# Patient Record
Sex: Female | Born: 2000 | Race: White | Hispanic: No | Marital: Single | Smoking: Never smoker
Health system: Southern US, Community
[De-identification: ages and names within clinical notes are randomized; demographics above are authoritative.]

## PROBLEM LIST (undated history)

## (undated) DIAGNOSIS — R6250 Unspecified lack of expected normal physiological development in childhood: Secondary | ICD-10-CM

## (undated) DIAGNOSIS — Z8679 Personal history of other diseases of the circulatory system: Secondary | ICD-10-CM

## (undated) DIAGNOSIS — R278 Other lack of coordination: Secondary | ICD-10-CM

## (undated) DIAGNOSIS — R4189 Other symptoms and signs involving cognitive functions and awareness: Secondary | ICD-10-CM

## (undated) DIAGNOSIS — E109 Type 1 diabetes mellitus without complications: Secondary | ICD-10-CM

## (undated) DIAGNOSIS — Z8249 Family history of ischemic heart disease and other diseases of the circulatory system: Secondary | ICD-10-CM

## (undated) DIAGNOSIS — F8189 Other developmental disorders of scholastic skills: Secondary | ICD-10-CM

## (undated) HISTORY — DX: Type 1 diabetes mellitus without complications: E10.9

## (undated) HISTORY — DX: Other symptoms and signs involving cognitive functions and awareness: R41.89

## (undated) HISTORY — PX: DILATION AND CURETTAGE, DIAGNOSTIC / THERAPEUTIC: SUR384

## (undated) HISTORY — DX: Unspecified lack of expected normal physiological development in childhood: R62.50

## (undated) HISTORY — PX: MULTIPLE TOOTH EXTRACTIONS: SHX2053

## (undated) HISTORY — DX: Other developmental disorders of scholastic skills: F81.89

## (undated) HISTORY — PX: INTRAUTERINE DEVICE (IUD) INSERTION: SHX5877

---
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
## 2019-01-08 DIAGNOSIS — E109 Type 1 diabetes mellitus without complications: Secondary | ICD-10-CM | POA: Diagnosis not present

## 2019-01-15 ENCOUNTER — Encounter (INDEPENDENT_AMBULATORY_CARE_PROVIDER_SITE_OTHER): Payer: Self-pay | Admitting: Family

## 2019-01-15 ENCOUNTER — Other Ambulatory Visit: Payer: Self-pay

## 2019-01-15 ENCOUNTER — Ambulatory Visit (INDEPENDENT_AMBULATORY_CARE_PROVIDER_SITE_OTHER): Payer: BC Managed Care – PPO | Admitting: Family

## 2019-01-15 VITALS — BP 110/72 | HR 80 | Ht 60.63 in | Wt 133.2 lb

## 2019-01-15 DIAGNOSIS — R6250 Unspecified lack of expected normal physiological development in childhood: Secondary | ICD-10-CM

## 2019-01-15 DIAGNOSIS — R748 Abnormal levels of other serum enzymes: Secondary | ICD-10-CM | POA: Diagnosis not present

## 2019-01-15 DIAGNOSIS — E10649 Type 1 diabetes mellitus with hypoglycemia without coma: Secondary | ICD-10-CM

## 2019-01-15 DIAGNOSIS — E109 Type 1 diabetes mellitus without complications: Secondary | ICD-10-CM | POA: Diagnosis not present

## 2019-01-15 DIAGNOSIS — Z794 Long term (current) use of insulin: Secondary | ICD-10-CM

## 2019-01-15 LAB — POCT GLYCOSYLATED HEMOGLOBIN (HGB A1C): Hemoglobin A1C: 6.8 % — AB (ref 4.0–5.6)

## 2019-01-15 LAB — POCT GLUCOSE (DEVICE FOR HOME USE): POC Glucose: 195 mg/dl — AB (ref 70–99)

## 2019-01-15 NOTE — Patient Instructions (Signed)
-  Always have fast sugar with you in case of low blood sugar (glucose tabs, regular juice or soda, candy) -Always wear your ID that states you have diabetes -Always bring your meter to your visit -Call/Email if you want to review blood sugars  Increase to 8 units of Lantus  Novolog per plan   Continue vitamin D supplement.

## 2019-01-15 NOTE — Progress Notes (Signed)
Pediatric Endocrinology Diabetes Consultation Follow-up Visit  Melanie ReiningKathryn Morrow 12/18/2000 [id number]  Chief Complaint: Follow-up type 1 diabetes   Ermalinda BarriosBrassfield, Mark, MD (Inactive)   HPI: Melanie Morrow  is a 18 y.o. female presenting for follow-up of type 1 diabetes. she is accompanied to this visit by her mother.  1. Melanie Morrow is a 18 y.o. with history of pervasive developmental disorder (also non-verbal) who was diagnosed with T1DM on 08/22/16 after presenting to her PCP with polyuria, polydipsia, weight loss and hyperglycemia.  At PCP's office she had BG of 238 with UA showing 2+ glucose and 2+ ketones.  She was admitted to [facility] pediatric floor where she was not in DKA.  She was started on an MDI regimen.  TFTs on admission were normal with negative ICA Ab and negative insulin Ab; GAD Ab were positive.  C-peptide was low at 1.9.  A1c was 10%.  2. Since last visit on 09/2018, she has been well.  No ED visits or hospitalizations.   They have not been doing a lot, mainly just virtual school and hanging out at home. They did get to spend time at the beach this summer which Melanie Morrow enjoyed. Mom reports that things are going well overall with diabetes. They have noticed blood sugars running a little bit higher in the mornings. She is not having very many lows.   Mom is concerned about changing her insulin plan because she spends a lot of time with her Grandparents and they may struggle with change. A few weeks ago, Grandmother was giving Melanie Morrow an injection and the needle broke. They were unsure if it broke off in [place] or not so they got xray done and saw PCP. No needle remnants were found.   Melanie Morrow is now taking a liquid Vitamin D supplement that has 2000 units per day. She takes 1 drop per day    Insulin regimen: Lantus 7 units  . Novolog 150/50/15 half unit plan  Hypoglycemia: Few lows since last visit, no glucagon needed. CGM Download: Using dexcom G6  - Avg Bg 179  - target range; in  target 45%, above target 54% and below target 1%   - Pattern of hyperglycemia overnight.   Med-alert ID: Wearing a new bracelet today Injection sites: arms, legs and abdomen. Rotating.  Uses arms for CGM  Annual labs due: 08/2019 Ophthalmology due: Not yet   ROS:  All systems reviewed with pertinent positives listed below; otherwise negative. Constitutional:Sleeping well. Weight stable.  Eyes; no vision changes. No blurry vision.  HENT: No difficulty swallowing. No neck pain  Respiratory: No increased work of breathing currently GI: No constipation or diarrhea GU: Occasional spotting. Has IUD.  Musculoskeletal: No joint deformity Neuro: Normal affect. No tremors or headaches.  Endocrine: As above    Past Medical History:   Past Medical History:  Diagnosis Date  . Dyspraxia   . Type 1 diabetes mellitus (HCC) 08/22/2016   +GAD Ab, neg insulin Ab and ICA Ab    Medications:  Outpatient Encounter Medications as of 01/15/2019  Medication Sig  . diazePAM 5 MG/5ML SOLN TAKE 10ML BY MOUTH 1 HOUR PRIOR TO APPOINTMENT  . glucagon 1 MG injection Follow package directions for low blood sugar.  . Insulin Glargine (LANTUS SOLOSTAR) 100 UNIT/ML Solostar Pen Use up to 50 units daily  . NOVOLOG FLEXPEN 100 UNIT/ML FlexPen INJECT UP TO 50 UNITS OF INSULIN PER DAY.   No facility-administered encounter medications on file as of 01/15/2019.     Allergies: Allergies  Allergen Reactions  . Penicillins Hives    amox.    Surgical History: Past Surgical History:  Procedure Laterality Date  . DILATION AND CURETTAGE, DIAGNOSTIC / THERAPEUTIC    . INTRAUTERINE DEVICE (IUD) INSERTION      Family History:  Family History  Problem Relation Age of Onset  . Diabetes Mother   . Diabetes Maternal Uncle   . Diabetes Maternal Grandfather    No family history of T1DM or hypothyroidism   Social History: Lives with: parents, grandparents also involved in her care Going into 11th grade, mother  happy with care provided by school. She will have the same teacher this year as last.  Physical Exam:  Vitals:   01/15/19 0838  BP: 110/72  Pulse: 80  Weight: 133 lb 3.2 oz (60.4 kg)  Height: 5' 0.63" (1.54 m)   BP 110/72   Pulse 80   Ht 5' 0.63" (1.54 m)   Wt 133 lb 3.2 oz (60.4 kg)   BMI 25.48 kg/m  Body mass index: body mass index is 25.48 kg/m. Blood pressure percentiles are not available for patients who are 18 years or older.  Ht Readings from Last 3 Encounters:  01/15/19 5' 0.63" (1.54 m) (8 %, Z= -1.43)*  10/15/18 5' 0.63" (1.54 m) (8 %, Z= -1.42)*  05/05/18 5' (1.524 m) (5 %, Z= -1.66)*   * Growth percentiles are based on CDC (Girls, 2-20 Years) data.   Wt Readings from Last 3 Encounters:  01/15/19 133 lb 3.2 oz (60.4 kg) (62 %, Z= 0.31)*  10/15/18 133 lb 10.1 oz (60.6 kg) (64 %, Z= 0.36)*  05/05/18 130 lb 12.8 oz (59.3 kg) (61 %, Z= 0.29)*   * Growth percentiles are based on CDC (Girls, 2-20 Years) data.    General: Well developed, well nourished female in no acute distress.  Developmentally delayed. Alert and oriented.  Head: Normocephalic, atraumatic.   Eyes:  Pupils equal and round. EOMI.   Sclera white.  No eye drainage.   Ears/Nose/Mouth/Throat: Nares patent, no nasal drainage.  Normal dentition, mucous membranes moist.   Neck: supple, no cervical lymphadenopathy, no thyromegaly Cardiovascular: regular rate, normal S1/S2, no murmurs Respiratory: No increased work of breathing.  Lungs clear to auscultation bilaterally.  No wheezes. Abdomen: soft, nontender, nondistended. Normal bowel sounds.  No appreciable masses  Extremities: warm, well perfused, cap refill < 2 sec.   Musculoskeletal: Normal muscle mass.  Normal strength Skin: warm, dry.  No rash or lesions. Neurologic: alert and oriented, Uses sign language, no tremor   Labs:   Results for orders placed or performed in visit on 01/15/19  POCT Glucose (Device for Home Use)  Result Value Ref Range    Glucose Fasting, POC     POC Glucose 195 (A) 70 - 99 mg/dl  POCT glycosylated hemoglobin (Hb A1C)  Result Value Ref Range   Hemoglobin A1C 6.8 (A) 4.0 - 5.6 %   HbA1c POC (<> result, manual entry)     HbA1c, POC (prediabetic range)     HbA1c, POC (controlled diabetic range)       Assessment/Plan: Knox is a 18 y.o. female with controlled type 1 diabetes on MDI and Dexcom CGM. Armonie and her family are doing an excellent job with her diabetes control. Her blood sugars are running slightly higher overnight. Hemoglobin A1c is 6.8% which meets the ADA goal of <7.5%.   1-4. Controlled diabetes mellitus type 1 without complications (HCC)/Adjustment reaction/Developmental delay/hypoglycemia unawareness - Increase her Lantus 8  units  - novolog 150/50/15 plan  - Reviewed CGM download. Discussed trends and patterns.  - Rotate injection sites to prevent scar tissue.  - bolus 15 minutes prior to eating to limit blood sugar spikes.  - Reviewed carb counting and importance of accurate carb counting.  - Discussed signs and symptoms of hypoglycemia. Always have glucose available.  - POCT glucose and hemoglobin A1c  - Reviewed growth chart.   5. Elevated alkaline phosphatase  - Continue vitmain D supplement (Super daily Vitamin D)    Follow-up:  3 months.   I have spent >25 minutes with >50% of time in counseling, education and instruction. When a patient is on insulin, intensive monitoring of blood glucose levels is necessary to avoid hyperglycemia and hypoglycemia. Severe hyperglycemia/hypoglycemia can lead to hospital admissions and be life threatening.      Hermenia Bers,  FNP-C  Pediatric Specialist  [address] [place]  [place], [address]  Tele: [phone]

## 2019-01-25 ENCOUNTER — Other Ambulatory Visit (INDEPENDENT_AMBULATORY_CARE_PROVIDER_SITE_OTHER): Payer: Self-pay | Admitting: Family

## 2019-03-04 DIAGNOSIS — Z3040 Encounter for surveillance of contraceptives, unspecified: Secondary | ICD-10-CM | POA: Diagnosis not present

## 2019-04-06 ENCOUNTER — Encounter (INDEPENDENT_AMBULATORY_CARE_PROVIDER_SITE_OTHER): Payer: Self-pay

## 2019-04-09 MED ORDER — DEXCOM G6 SENSOR MISC: 1.0000 [IU] | 5 refills | Status: DC

## 2019-04-09 MED ORDER — BD PEN NEEDLE MINI U/F 31G X 5 MM MISC: 5 refills | Status: DC

## 2019-04-09 MED ORDER — DEXCOM G6 TRANSMITTER MISC: 1.0000 [IU] | 1 refills | Status: DC

## 2019-04-11 ENCOUNTER — Encounter (INDEPENDENT_AMBULATORY_CARE_PROVIDER_SITE_OTHER): Payer: Self-pay

## 2019-04-12 MED ORDER — INSULIN PEN NEEDLE 32G X 4 MM MISC: 5 refills | Status: DC

## 2019-04-19 ENCOUNTER — Other Ambulatory Visit: Payer: Self-pay

## 2019-04-19 ENCOUNTER — Encounter (INDEPENDENT_AMBULATORY_CARE_PROVIDER_SITE_OTHER): Payer: Self-pay | Admitting: Family

## 2019-04-19 ENCOUNTER — Ambulatory Visit (INDEPENDENT_AMBULATORY_CARE_PROVIDER_SITE_OTHER): Payer: Medicaid Other | Admitting: Family

## 2019-04-19 VITALS — BP 112/68 | Ht 60.75 in | Wt 133.0 lb

## 2019-04-19 DIAGNOSIS — R748 Abnormal levels of other serum enzymes: Secondary | ICD-10-CM

## 2019-04-19 DIAGNOSIS — R6250 Unspecified lack of expected normal physiological development in childhood: Secondary | ICD-10-CM

## 2019-04-19 DIAGNOSIS — E109 Type 1 diabetes mellitus without complications: Secondary | ICD-10-CM

## 2019-04-19 DIAGNOSIS — R739 Hyperglycemia, unspecified: Secondary | ICD-10-CM

## 2019-04-19 DIAGNOSIS — E10649 Type 1 diabetes mellitus with hypoglycemia without coma: Secondary | ICD-10-CM | POA: Diagnosis not present

## 2019-04-19 LAB — POCT GLYCOSYLATED HEMOGLOBIN (HGB A1C): Hemoglobin A1C: 6.5 % — AB (ref 4.0–5.6)

## 2019-04-19 LAB — POCT GLUCOSE (DEVICE FOR HOME USE): Glucose Fasting, POC: 192 mg/dL — AB (ref 70–99)

## 2019-04-19 NOTE — Progress Notes (Signed)
Pediatric Endocrinology Diabetes Consultation Follow-up Visit  Melanie Morrow 2000-10-06 [id number]  Chief Complaint: Follow-up type 1 diabetes   Melanie Sears, MD (Inactive)   HPI: Melanie Morrow  is a 19 y.o. female presenting for follow-up of type 1 diabetes. she is accompanied to this visit by her mother.  39. Melanie Morrow is a 20 y.o. with history of pervasive developmental disorder (also non-verbal) who was diagnosed with T1DM on 08/22/16 after presenting to her PCP with polyuria, polydipsia, weight loss and hyperglycemia.  At PCP's office she had BG of 238 with UA showing 2+ glucose and 2+ ketones.  She was admitted to [facility] pediatric floor where she was not in DKA.  She was started on an MDI regimen.  TFTs on admission were normal with negative ICA Ab and negative insulin Ab; GAD Ab were positive.  C-peptide was low at 1.9.  A1c was 10%.  2. Since last visit on 12/2018, she has been well.  No ED visits or hospitalizations.   She has mainly been doing online school which she is doing well at and watching movies in her free time. She is wearing Dexcom CGM which is working well for them. Mom reporst things are going great. She has very few blood sugars spikes and only occasional low blood sugars. Mom feels like Melanie Morrow having a fairly predictable diet is very helpful. Mom has a rotation for Melanie Morrow shots so that they only use each area once per day to prevent scar tissue.   Melanie Morrow is now taking a liquid Vitamin D supplement that has 2000 units per day. She takes 1 drop per day   Insulin regimen: Lantus 8 units  . Novolog 150/50/15 half unit plan  Hypoglycemia: Few lows since last visit, no glucagon needed. CGM Download: Using dexcom G6  - Avg Bg 176   - In target 50%, above target 50%   - Overall blood sguars are very well controlled with minimal variability.  Med-alert ID: Wearing a new bracelet today Injection sites: arms, legs and abdomen. Rotating.  Uses arms for CGM  Annual labs  due: 08/2019 Ophthalmology due: Not yet   ROS:  All systems reviewed with pertinent positives listed below; otherwise negative. Constitutional:Sleeping well. Weight stable.   Eyes; no vision changes. No blurry vision.  HENT: No difficulty swallowing. No neck pain  Respiratory: No increased work of breathing currently GI: No constipation or diarrhea GU: Occasional spotting. Has IUD.  Musculoskeletal: No joint deformity Neuro: Normal affect. No tremors or headaches.  Endocrine: As above    Past Medical History:   Past Medical History:  Diagnosis Date  . Dyspraxia   . Type 1 diabetes mellitus ([place]) 08/22/2016   +GAD Ab, neg insulin Ab and ICA Ab    Medications:  Outpatient Encounter Medications as of 04/19/2019  Medication Sig Note  . Continuous Blood Gluc Sensor (DEXCOM G6 SENSOR) MISC 1 Units by Does not apply route as directed. Change sensor every 10 days   . Continuous Blood Gluc Transmit (DEXCOM G6 TRANSMITTER) MISC 1 Units by Does not apply route every 3 (three) months.   . diazePAM 5 MG/5ML SOLN TAKE 10ML BY MOUTH 1 HOUR PRIOR TO APPOINTMENT 04/19/2019: Only before the dentist   . glucagon 1 MG injection Follow package directions for low blood sugar.   . Insulin Glargine (LANTUS SOLOSTAR) 100 UNIT/ML Solostar Pen USE UP TO 50 UNITS DAILY   . Insulin Pen Needle 32G X 4 MM MISC Use to inject insulin 6x daily   .  NOVOLOG FLEXPEN 100 UNIT/ML FlexPen INJECT UP TO 50 UNITS OF INSULIN PER DAY.    No facility-administered encounter medications on file as of 04/19/2019.    Allergies: Allergies  Allergen Reactions  . Penicillins Hives    amox.    Surgical History: Past Surgical History:  Procedure Laterality Date  . DILATION AND CURETTAGE, DIAGNOSTIC / THERAPEUTIC    . INTRAUTERINE DEVICE (IUD) INSERTION      Family History:  Family History  Problem Relation Age of Onset  . Diabetes Mother   . Diabetes Maternal Uncle   . Diabetes Maternal Grandfather    No family  history of T1DM or hypothyroidism   Social History: Lives with: parents, grandparents also involved in her care Going into 11th grade, mother happy with care provided by school. She will have the same teacher this year as last.  Physical Exam:  Vitals:   04/19/19 0833  BP: 112/68  Weight: 133 lb (60.3 kg)  Height: 5' 0.75" (1.543 m)   BP 112/68   Ht 5' 0.75" (1.543 m)   Wt 133 lb (60.3 kg)   BMI 25.34 kg/m  Body mass index: body mass index is 25.34 kg/m. Blood pressure percentiles are not available for patients who are 18 years or older.  Ht Readings from Last 3 Encounters:  04/19/19 5' 0.75" (1.543 m) (8 %, Z= -1.38)*  01/15/19 5' 0.63" (1.54 m) (8 %, Z= -1.43)*  10/15/18 5' 0.63" (1.54 m) (8 %, Z= -1.42)*   * Growth percentiles are based on CDC (Girls, 2-20 Years) data.   Wt Readings from Last 3 Encounters:  04/19/19 133 lb (60.3 kg) (61 %, Z= 0.27)*  01/15/19 133 lb 3.2 oz (60.4 kg) (62 %, Z= 0.31)*  10/15/18 133 lb 10.1 oz (60.6 kg) (64 %, Z= 0.36)*   * Growth percentiles are based on CDC (Girls, 2-20 Years) data.    General: Well developed, well nourished female in no acute distress.  Developmentally delayed. Alert and oriented.  Head: Normocephalic, atraumatic.   Eyes:  Pupils equal and round. EOMI.   Sclera white.  No eye drainage.   Ears/Nose/Mouth/Throat: Nares patent, no nasal drainage.  Normal dentition, mucous membranes moist.   Neck: supple, no cervical lymphadenopathy, no thyromegaly Cardiovascular: regular rate, normal S1/S2, no murmurs Respiratory: No increased work of breathing.  Lungs clear to auscultation bilaterally.  No wheezes. Abdomen: soft, nontender, nondistended. Normal bowel sounds.  No appreciable masses  Extremities: warm, well perfused, cap refill < 2 sec.   Musculoskeletal: Normal muscle mass.  Normal strength Skin: warm, dry.  No rash or lesions. Neurologic: alert and oriented, Uses sign language, no tremor   Labs:   Results for  orders placed or performed in visit on 04/19/19  POCT Glucose (Device for Home Use)  Result Value Ref Range   Glucose Fasting, POC 192 (A) 70 - 99 mg/dL   POC Glucose    POCT glycosylated hemoglobin (Hb A1C)  Result Value Ref Range   Hemoglobin A1C 6.5 (A) 4.0 - 5.6 %   HbA1c POC (<> result, manual entry)     HbA1c, POC (prediabetic range)     HbA1c, POC (controlled diabetic range)       Assessment/Plan: Melanie Morrow is a 19 y.o. female with controlled type 1 diabetes on MDI and Dexcom CGM. Diabetes is well managed by her family. Hemoglobin A1c is 6.5% which meets ADA goal of <7.5%. Will repeat alkaline phosphatase and vitamin D today.   1-4. 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 Discussed trends and patterns.  - Rotate injection sites to prevent scar tissue.   - Reviewed carb counting and importance of accurate carb counting.  - Discussed signs and symptoms of hypoglycemia. Always have glucose available.  - POCT glucose and hemoglobin A1c  - Reviewed growth chart.    5. Elevated alkaline phosphatase  - Continue vitmain D supplement (Super daily Vitamin D)  - Repeat Alk phos and 25 hydroxy vitamin D    Follow-up:  3 months.   >46 spent today reviewing the medical chart, counseling the patient/family, and documenting today's visit.   When a patient is on insulin, intensive monitoring of blood glucose levels is necessary to avoid hyperglycemia and hypoglycemia. Severe hyperglycemia/hypoglycemia can lead to hospital admissions and be life threatening.      Hermenia Bers,  FNP-C  Pediatric Specialist  [address] [place]  [place], [address]  Tele: [phone]

## 2019-04-19 NOTE — Patient Instructions (Signed)
-  Always have fast sugar with you in case of low blood sugar (glucose tabs, regular juice or soda, candy) -Always wear your ID that states you have diabetes -Always bring your meter to your visit -Call/Email if you want to review blood sugars   

## 2019-04-20 LAB — ALKALINE PHOSPHATASE: Alkaline phosphatase (APISO): 768 U/L — ABNORMAL HIGH (ref 36–128)

## 2019-04-20 LAB — VITAMIN D 25 HYDROXY (VIT D DEFICIENCY, FRACTURES): Vit D, 25-Hydroxy: 20 ng/mL — ABNORMAL LOW (ref 30–100)

## 2019-06-13 ENCOUNTER — Other Ambulatory Visit (INDEPENDENT_AMBULATORY_CARE_PROVIDER_SITE_OTHER): Payer: Self-pay | Admitting: Family

## 2019-06-16 ENCOUNTER — Other Ambulatory Visit (INDEPENDENT_AMBULATORY_CARE_PROVIDER_SITE_OTHER): Payer: Self-pay | Admitting: Family

## 2019-06-16 ENCOUNTER — Encounter (INDEPENDENT_AMBULATORY_CARE_PROVIDER_SITE_OTHER): Payer: Self-pay

## 2019-07-22 ENCOUNTER — Encounter (INDEPENDENT_AMBULATORY_CARE_PROVIDER_SITE_OTHER): Payer: Self-pay | Admitting: Family

## 2019-07-22 ENCOUNTER — Ambulatory Visit (INDEPENDENT_AMBULATORY_CARE_PROVIDER_SITE_OTHER): Payer: Medicaid Other | Admitting: Family

## 2019-07-22 ENCOUNTER — Other Ambulatory Visit: Payer: Self-pay

## 2019-07-22 VITALS — BP 118/80 | HR 102 | Ht 60.63 in | Wt 134.8 lb

## 2019-07-22 DIAGNOSIS — E109 Type 1 diabetes mellitus without complications: Secondary | ICD-10-CM

## 2019-07-22 DIAGNOSIS — Z794 Long term (current) use of insulin: Secondary | ICD-10-CM

## 2019-07-22 DIAGNOSIS — R6250 Unspecified lack of expected normal physiological development in childhood: Secondary | ICD-10-CM | POA: Diagnosis not present

## 2019-07-22 DIAGNOSIS — E10649 Type 1 diabetes mellitus with hypoglycemia without coma: Secondary | ICD-10-CM

## 2019-07-22 DIAGNOSIS — R748 Abnormal levels of other serum enzymes: Secondary | ICD-10-CM

## 2019-07-22 DIAGNOSIS — R739 Hyperglycemia, unspecified: Secondary | ICD-10-CM

## 2019-07-22 LAB — POCT GLYCOSYLATED HEMOGLOBIN (HGB A1C): Hemoglobin A1C: 6.3 % — AB (ref 4.0–5.6)

## 2019-07-22 LAB — POCT GLUCOSE (DEVICE FOR HOME USE): Glucose Fasting, POC: 170 mg/dL — AB (ref 70–99)

## 2019-07-22 MED ORDER — ALCOHOL PADS 70 % PADS: MEDICATED_PAD | 6 refills | Status: AC

## 2019-07-22 NOTE — Progress Notes (Signed)
Pediatric Endocrinology Diabetes Consultation Follow-up Visit  [name] 2000/05/31 [id number]  Chief Complaint: Follow-up type 1 diabetes   Melanie Sears, MD   HPI: Melanie Morrow  is a 19 y.o. female presenting for follow-up of type 1 diabetes. she is accompanied to this visit by her mother.  74. Melanie Morrow is a 67 y.o. with history of pervasive developmental disorder (also non-verbal) who was diagnosed with T1DM on 08/22/16 after presenting to her PCP with polyuria, polydipsia, weight loss and hyperglycemia.  At PCP's office she had BG of 238 with UA showing 2+ glucose and 2+ ketones.  She was admitted to [facility] pediatric floor where she was not in DKA.  She was started on an MDI regimen.  TFTs on admission were normal with negative ICA Ab and negative insulin Ab; GAD Ab were positive.  C-peptide was low at 1.9.  A1c was 10%.  2. Since last visit on 04/2018, she has been well.  No ED visits or hospitalizations.   She remains doing virtual school, she also recently got her COVID vaccine. She is wearing Dexcom CGM. Mom feels like her blood sugars have been "running like normal". She will occasionally have a high spike after breakfast if she eats high carb meal. They usually give her shots after she eats to make sure she finishes the meal. Mom feels like over the past month her overnight blood sugars have been higher.   Melanie Morrow is now taking a liquid Vitamin D supplement that has 2000 units per day. She takes 2 drop per day for a total of 4000 units per day   Insulin regimen: Lantus 8 units  . Novolog 150/50/15 half unit plan  Hypoglycemia: Few lows since last visit, no glucagon needed. CGM Download: Using dexcom G6  - Avg Bg 177  - Target range; in target 47%, above target 52% and below target 1%.  Med-alert ID: Wearing a new bracelet today Injection sites: arms, legs and abdomen. Rotating.  Uses arms for CGM  Annual labs due: 08/2019 Ophthalmology due: Not yet   ROS:  All systems  reviewed with pertinent positives listed below; otherwise negative. Constitutional:Sleeping well. Weight stable.   Eyes; no vision changes. No blurry vision.  HENT: No difficulty swallowing. No neck pain  Respiratory: No increased work of breathing currently GI: No constipation or diarrhea GU: Occasional spotting. Has IUD.  Musculoskeletal: No joint deformity Neuro: Normal affect. No tremors or headaches.  Endocrine: As above    Past Medical History:   Past Medical History:  Diagnosis Date  . Dyspraxia   . Type 1 diabetes mellitus ([place]) 08/22/2016   +GAD Ab, neg insulin Ab and ICA Ab    Medications:  Outpatient Encounter Medications as of 07/22/2019  Medication Sig Note  . Continuous Blood Gluc Sensor (DEXCOM G6 SENSOR) MISC 1 Units by Does not apply route as directed. Change sensor every 10 days   . Continuous Blood Gluc Transmit (DEXCOM G6 TRANSMITTER) MISC 1 Units by Does not apply route every 3 (three) months.   . diazePAM 5 MG/5ML SOLN TAKE 10ML BY MOUTH 1 HOUR PRIOR TO APPOINTMENT 04/19/2019: Only before the dentist   . glucagon 1 MG injection Follow package directions for low blood sugar.   . Insulin Pen Needle 32G X 4 MM MISC Use to inject insulin 6x daily   . LANTUS SOLOSTAR 100 UNIT/ML Solostar Pen USE UP TO 50 UNITS DAILY   . NOVOLOG FLEXPEN 100 UNIT/ML FlexPen INJECT UP TO 50 UNITS OF INSULIN PER  DAY.    No facility-administered encounter medications on file as of 07/22/2019.    Allergies: Allergies  Allergen Reactions  . Penicillins Hives    amox.    Surgical History: Past Surgical History:  Procedure Laterality Date  . DILATION AND CURETTAGE, DIAGNOSTIC / THERAPEUTIC    . INTRAUTERINE DEVICE (IUD) INSERTION      Family History:  Family History  Problem Relation Age of Onset  . Diabetes Mother   . Diabetes Maternal Uncle   . Diabetes Maternal Grandfather    No family history of T1DM or hypothyroidism   Social History: Lives with: parents, grandparents  also involved in her care Going into 12th grade, mother happy with care provided by school. She will have the same teacher this year as last.  Physical Exam:  Vitals:   07/22/19 0841  BP: 118/80  Pulse: (!) 102  Weight: 134 lb 12.8 oz (61.1 kg)  Height: 5' 0.63" (1.54 m)   BP 118/80   Pulse (!) 102   Ht 5' 0.63" (1.54 m)   Wt 134 lb 12.8 oz (61.1 kg)   BMI 25.78 kg/m  Body mass index: body mass index is 25.78 kg/m. Blood pressure percentiles are not available for patients who are 18 years or older.  Ht Readings from Last 3 Encounters:  07/22/19 5' 0.63" (1.54 m) (8 %, Z= -1.43)*  04/19/19 5' 0.75" (1.543 m) (8 %, Z= -1.38)*  01/15/19 5' 0.63" (1.54 m) (8 %, Z= -1.43)*   * Growth percentiles are based on CDC (Girls, 2-20 Years) data.   Wt Readings from Last 3 Encounters:  07/22/19 134 lb 12.8 oz (61.1 kg) (63 %, Z= 0.32)*  04/19/19 133 lb (60.3 kg) (61 %, Z= 0.27)*  01/15/19 133 lb 3.2 oz (60.4 kg) (62 %, Z= 0.31)*   * Growth percentiles are based on CDC (Girls, 2-20 Years) data.    General: Well developed, well nourished female in no acute distress.   Head: Normocephalic, atraumatic.   Eyes:  Pupils equal and round. EOMI.   Sclera white.  No eye drainage.   Ears/Nose/Mouth/Throat: Nares patent, no nasal drainage.  Normal dentition, mucous membranes moist.   Neck: supple, no cervical lymphadenopathy, no thyromegaly Cardiovascular: regular rate, normal S1/S2, no murmurs Respiratory: No increased work of breathing.  Lungs clear to auscultation bilaterally.  No wheezes. Abdomen: soft, nontender, nondistended. Normal bowel sounds.  No appreciable masses  Extremities: warm, well perfused, cap refill < 2 sec.   Musculoskeletal: Normal muscle mass.  Normal strength Skin: warm, dry.  No rash or lesions. Neurologic: alert, + developmental delay, no tremor   Labs:   Results for orders placed or performed in visit on 07/22/19  POCT glycosylated hemoglobin (Hb A1C)  Result  Value Ref Range   Hemoglobin A1C 6.3 (A) 4.0 - 5.6 %   HbA1c POC (<> result, manual entry)     HbA1c, POC (prediabetic range)     HbA1c, POC (controlled diabetic range)    POCT Glucose (Device for Home Use)  Result Value Ref Range   Glucose Fasting, POC 170 (A) 70 - 99 mg/dL   POC Glucose       Assessment/Plan: Dalylah is a 19 y.o. female with controlled type 1 diabetes on MDI and Dexcom CGM. Excellent control overall due to close supervision and care by family. Blood sugars are stable between 170-200 overnight, will try increasing lantus to get closed to 150. Hemoglobin A1c is 6.3% which meets ADA goal of <7.5%.  1-4. Controlled diabetes mellitus type 1 without complications (HCC)/Adjustment reaction/Developmental delay/hypoglycemia unawareness - Increase Lantus to 9 units  - novolog 150/50/15 plan  - Reviewed CGM download. Discussed trends and patterns.  - Rotate injection sites to prevent scar tissue.  - bolus 15 minutes prior to eating to limit blood sugar spikes.  - Reviewed carb counting and importance of accurate carb counting.  - Discussed signs and symptoms of hypoglycemia. Always have glucose available.  - POCT glucose and hemoglobin A1c  - Reviewed growth chart.    5. Elevated alkaline phosphatase  - Continue vitmain D supplement (Super daily Vitamin D)     Follow-up:  3 months.   >45 spent today reviewing the medical chart, counseling the patient/family, and documenting today's visit.   When a patient is on insulin, intensive monitoring of blood glucose levels is necessary to avoid hyperglycemia and hypoglycemia. Severe hyperglycemia/hypoglycemia can lead to hospital admissions and be life threatening.      Gretchen Short,  FNP-C  Pediatric Specialist  [address]  [place], [address]  Tele: [phone]

## 2019-07-22 NOTE — Patient Instructions (Signed)
Increase lantus to 9 units per day  - Novolog 150/50/15 plan   - Continue vitamin D supplement daily.   -Always have fast sugar with you in case of low blood sugar (glucose tabs, regular juice or soda, candy) -Always wear your ID that states you have diabetes -Always bring your meter to your visit -Call/Email if you want to review blood sugars

## 2019-07-23 ENCOUNTER — Ambulatory Visit (INDEPENDENT_AMBULATORY_CARE_PROVIDER_SITE_OTHER): Payer: Medicaid Other | Admitting: "Endocrinology

## 2019-09-15 ENCOUNTER — Encounter (HOSPITAL_COMMUNITY): Payer: Self-pay | Admitting: *Deleted

## 2019-09-15 ENCOUNTER — Other Ambulatory Visit: Payer: Self-pay

## 2019-09-15 ENCOUNTER — Emergency Department (HOSPITAL_COMMUNITY)
Admission: EM | Admit: 2019-09-15 | Discharge: 2019-09-16 | Disposition: A | Discharge status: Home / Self Care | Payer: Medicaid Other | Attending: Emergency Medicine | Admitting: Emergency Medicine

## 2019-09-15 DIAGNOSIS — R1084 Generalized abdominal pain: Secondary | ICD-10-CM

## 2019-09-15 DIAGNOSIS — E109 Type 1 diabetes mellitus without complications: Secondary | ICD-10-CM | POA: Insufficient documentation

## 2019-09-15 DIAGNOSIS — R109 Unspecified abdominal pain: Secondary | ICD-10-CM | POA: Diagnosis present

## 2019-09-15 LAB — COMPREHENSIVE METABOLIC PANEL
ALT: 21 U/L (ref 0–44)
AST: 20 U/L (ref 15–41)
Albumin: 4.3 g/dL (ref 3.5–5.0)
Alkaline Phosphatase: 725 U/L — ABNORMAL HIGH (ref 38–126)
Anion gap: 13 (ref 5–15)
BUN: 11 mg/dL (ref 6–20)
CO2: 23 mmol/L (ref 22–32)
Calcium: 8.9 mg/dL (ref 8.9–10.3)
Chloride: 97 mmol/L — ABNORMAL LOW (ref 98–111)
Creatinine, Ser: 0.58 mg/dL (ref 0.44–1.00)
GFR calc Af Amer: >60 mL/min (ref >60)
GFR calc non Af Amer: >60 mL/min (ref >60)
Glucose, Bld: 212 mg/dL — ABNORMAL HIGH (ref 70–99)
Potassium: 3.8 mmol/L (ref 3.5–5.1)
Sodium: 133 mmol/L — ABNORMAL LOW (ref 135–145)
Total Bilirubin: 1.8 mg/dL — ABNORMAL HIGH (ref 0.3–1.2)
Total Protein: 7.6 g/dL (ref 6.5–8.1)

## 2019-09-15 LAB — CBC
HCT: 37.3 % (ref 36.0–46.0)
Hemoglobin: 12.7 g/dL (ref 12.0–15.0)
MCH: 29.2 pg (ref 26.0–34.0)
MCHC: 34 g/dL (ref 30.0–36.0)
MCV: 85.7 fL (ref 80.0–100.0)
Platelets: 278 10*3/uL (ref 150–400)
RBC: 4.35 MIL/uL (ref 3.87–5.11)
RDW: 13.7 % (ref 11.5–15.5)
WBC: 6.5 10*3/uL (ref 4.0–10.5)
nRBC: 0 % (ref 0.0–0.2)

## 2019-09-15 LAB — URINALYSIS, ROUTINE W REFLEX MICROSCOPIC
Bilirubin Urine: NEGATIVE
Glucose, UA: 50 mg/dL — AB
Ketones, ur: 80 mg/dL — AB
Leukocytes,Ua: NEGATIVE
Nitrite: NEGATIVE
Protein, ur: 100 mg/dL — AB
Specific Gravity, Urine: 1.032 — ABNORMAL HIGH (ref 1.005–1.030)
pH: 5 (ref 5.0–8.0)

## 2019-09-15 LAB — LIPASE, BLOOD: Lipase: 15 U/L (ref 11–51)

## 2019-09-15 LAB — I-STAT BETA HCG BLOOD, ED (MC, WL, AP ONLY): I-stat hCG, quantitative: <5 m[IU]/mL (ref <5)

## 2019-09-15 MED ORDER — ACETAMINOPHEN 160 MG/5ML PO SOLN
650.0000 mg | Freq: Once | ORAL | Status: AC
  Administered 2019-09-16: 650 mg via ORAL
  Filled 2019-09-15: qty 20.3

## 2019-09-15 MED ORDER — SODIUM CHLORIDE 0.9% FLUSH: 3.0000 mL | Freq: Once | INTRAVENOUS | Status: DC

## 2019-09-15 MED ORDER — SODIUM CHLORIDE 0.9 % IV BOLUS
1000.0000 mL | Freq: Once | INTRAVENOUS | Status: AC
  Administered 2019-09-16: 1000 mL via INTRAVENOUS

## 2019-09-15 MED ORDER — ONDANSETRON HCL 4 MG/2ML IJ SOLN
4.0000 mg | Freq: Once | INTRAMUSCULAR | Status: AC
  Administered 2019-09-16: 4 mg via INTRAVENOUS
  Filled 2019-09-15: qty 2

## 2019-09-15 NOTE — ED Triage Notes (Addendum)
Pt is nonverbal. Mother states pt started Monday to c/o abd pain. Today continues  to run a fever that started yesterday.   Pt was taken to UC today. UA negative, Nasal swab for flu and covid negative. Instructed to come to the ED. Mother gave pt Tylenol a few minutes till 6 today

## 2019-09-16 ENCOUNTER — Emergency Department (HOSPITAL_COMMUNITY): Payer: Medicaid Other

## 2019-09-16 ENCOUNTER — Encounter (HOSPITAL_COMMUNITY): Payer: Self-pay

## 2019-09-16 ENCOUNTER — Other Ambulatory Visit (INDEPENDENT_AMBULATORY_CARE_PROVIDER_SITE_OTHER): Payer: Self-pay | Admitting: Family

## 2019-09-16 DIAGNOSIS — R748 Abnormal levels of other serum enzymes: Secondary | ICD-10-CM

## 2019-09-16 DIAGNOSIS — E559 Vitamin D deficiency, unspecified: Secondary | ICD-10-CM

## 2019-09-16 MED ORDER — ONDANSETRON HCL 4 MG/5ML PO SOLN: 4.0000 mg | Freq: Three times a day (TID) | ORAL | 0 refills | Status: DC | PRN

## 2019-09-16 MED ORDER — ACETAMINOPHEN 160 MG/5ML PO SUSP: 650.0000 mg | Freq: Four times a day (QID) | ORAL | 0 refills | Status: DC | PRN

## 2019-09-16 MED ORDER — SODIUM CHLORIDE (PF) 0.9 % IJ SOLN
INTRAMUSCULAR | Status: AC
  Filled 2019-09-16: qty 50

## 2019-09-16 MED ORDER — INSULIN GLARGINE 100 UNIT/ML ~~LOC~~ SOLN
9.0000 [IU] | Freq: Once | SUBCUTANEOUS | Status: AC
  Administered 2019-09-16: 9 [IU] via SUBCUTANEOUS
  Filled 2019-09-16: qty 0.09

## 2019-09-16 MED ORDER — IBUPROFEN 100 MG/5ML PO SUSP: 400.0000 mg | Freq: Four times a day (QID) | ORAL | 0 refills | Status: DC | PRN

## 2019-09-16 MED ORDER — IOHEXOL 300 MG/ML  SOLN
100.0000 mL | Freq: Once | INTRAMUSCULAR | Status: AC | PRN
  Administered 2019-09-16: 80 mL via INTRAVENOUS

## 2019-09-16 NOTE — Discharge Instructions (Signed)
Thank you for allowing me to care for you today in the Emergency Department.   Melanie Morrow's workup today in the ER was reassuring for her fever and abdominal pain.  She can have 1 dose of Zofran every 8 hours as needed for nausea and vomiting.  I would also recommend following a bland diet for the next few days to help with her pain and symptoms.  She can advance her diet back to her regular diet as tolerated.  Make sure that she continues to drink plenty of fluids so that she does not get dehydrated.  She can have Tylenol or ibuprofen once every 6 hours for fever or pain.  You can also alternate between these 2 medications every 3 hours.  For instance, she can have Tylenol at noon, followed by ibuprofen at 3, followed by a second dose of Tylenol at 6.  I have given you a prescription for both of these medications so that you can know her dosing.  Please follow-up with endocrinology regarding the other findings we discussed on her CT.  I have also sent them a message.  Return to the emergency department if she stops producing urine, develops uncontrollable vomiting despite taking Zofran, if she develops intractable pain despite taking Tylenol and ibuprofen, or she develops other new, concerning symptoms.

## 2019-09-16 NOTE — ED Notes (Signed)
Pt given water for PO fluid challenge

## 2019-09-16 NOTE — ED Provider Notes (Signed)
[facility]-EMERGENCY DEPT Provider Note   CSN: [id number] Arrival date & time: 09/15/19  1813     History Chief Complaint  Patient presents with  . Fever  . Abdominal Pain    Melanie Morrow is a 19 y.o. female who is nonverbal with a h/o Type 1 DM and Vitamin D deficiency who is accompanied to the Emergency Department by her mother with a chief complaint of abdominal pain.   The patient's mother reports that she began complaining of abdominal pain three nights ago.  The pain seemed to improve the following morning, but returned later in the day along with a tactile fever that was treated with Tylenol and have persisted.  Her mother reports that she has been eating less since onset of symptoms.  No coughing, shortness of breath, dysphagia, choking, sore throat, diarrhea, vomiting, or vaginal discharge.  Earlier today, she took the patient to urgent care after she developed a fever of 103.  She had a negative urinalysis, influenza, and COVID-19 test.  No history of abdominal surgery.  Patient is currently on her menstrual cycle.  She has an IUD in place for heavy vaginal bleeding, but is not sexually active.  Level V caveat as patient is nonverbal.  The history is provided by the patient. No language interpreter was used.       Past Medical History:  Diagnosis Date  . Dyspraxia   . Type 1 diabetes mellitus (HCC) 08/22/2016   +GAD Ab, neg insulin Ab and ICA Ab    Patient Active Problem List   Diagnosis Date Noted  . Elevated alkaline phosphatase level 05/05/2018  . Insulin dose changed (HCC) 07/16/2017  . Hypoglycemia unawareness in type 1 diabetes mellitus (HCC) 07/16/2017  . Parent coping with child illness or disability 01/15/2017  . Adjustment reaction to medical therapy 01/15/2017  . Developmental delay 01/15/2017  . Nonverbal 01/15/2017  . New onset of diabetes mellitus in pediatric patient (HCC) 08/22/2016    Past Surgical History:  Procedure  Laterality Date  . DILATION AND CURETTAGE, DIAGNOSTIC / THERAPEUTIC    . INTRAUTERINE DEVICE (IUD) INSERTION       OB History   No obstetric history on file.     Family History  Problem Relation Age of Onset  . Diabetes Mother   . Diabetes Maternal Uncle   . Diabetes Maternal Grandfather     Social History   Tobacco Use  . Smoking status: Never Smoker  . Smokeless tobacco: Never Used  Substance Use Topics  . Alcohol use: Not on file  . Drug use: No    Home Medications Prior to Admission medications   Medication Sig Start Date End Date Taking? Authorizing Provider  LANTUS SOLOSTAR 100 UNIT/ML Solostar Pen USE UP TO 50 UNITS DAILY Patient taking differently: Inject 9 Units into the skin at bedtime.  06/16/19  Yes Gretchen Short, NP  levonorgestrel (MIRENA) 20 MCG/24HR IUD 1 each by Intrauterine route once. 11/27/16  Yes [provider]  NOVOLOG FLEXPEN 100 UNIT/ML FlexPen INJECT UP TO 50 UNITS OF INSULIN PER DAY. Patient taking differently: Inject 2-13 Units into the skin See admin instructions. Three times a day on sliding scale 06/16/19  Yes Gretchen Short, NP  acetaminophen (TYLENOL CHILDRENS) 160 MG/5ML suspension Take 20.3 mLs (650 mg total) by mouth every 6 (six) hours as needed for moderate pain or fever. 09/16/19   Gustavo Dispenza A, PA-C  Alcohol Swabs (ALCOHOL PADS) 70 % PADS 6 wipes per day 07/22/19  Gretchen Short, NP  Continuous Blood Gluc Sensor (DEXCOM G6 SENSOR) MISC 1 Units by Does not apply route as directed. Change sensor every 10 days 04/09/19   Gretchen Short, NP  Continuous Blood Gluc Transmit (DEXCOM G6 TRANSMITTER) MISC 1 Units by Does not apply route every 3 (three) months. 04/09/19   Gretchen Short, NP  glucagon 1 MG injection Follow package directions for low blood sugar. 08/17/18 08/17/19  Gretchen Short, NP  ibuprofen (ADVIL) 100 MG/5ML suspension Take 20 mLs (400 mg total) by mouth every 6 (six) hours as needed for fever. 09/16/19   Aava Deland,  Nidal Rivet A, PA-C  Insulin Pen Needle 32G X 4 MM MISC Use to inject insulin 6x daily 04/12/19   Gretchen Short, NP  ondansetron [facility]) 4 MG/5ML solution Take 5 mLs (4 mg total) by mouth every 8 (eight) hours as needed for nausea or vomiting. 09/16/19   Chisom Aust A, PA-C    Allergies    Penicillins and Amoxicillin  Review of Systems   Review of Systems  Unable to perform ROS: Patient nonverbal  Constitutional: Positive for fever.  Gastrointestinal: Positive for abdominal pain and nausea.    Physical Exam Updated Vital Signs BP (!) 122/56   Pulse (!) 110   Temp 98.8 F (37.1 C) (Oral)   Resp 16   Wt 61.2 kg   SpO2 100%   BMI 25.82 kg/m   Physical Exam Vitals and nursing note reviewed.  Constitutional:      General: She is not in acute distress.    Appearance: She is not ill-appearing, toxic-appearing or diaphoretic.     Comments: Nonverbal.  Acknowledges caregiver.  HENT:     Head: Normocephalic.     Mouth/Throat:     Comments: Posterior oropharynx is unremarkable. Eyes:     Conjunctiva/sclera: Conjunctivae normal.  Cardiovascular:     Rate and Rhythm: Normal rate and regular rhythm.     Heart sounds: No murmur heard.  No friction rub. No gallop.   Pulmonary:     Effort: Pulmonary effort is normal. No respiratory distress.     Breath sounds: No stridor. No wheezing, rhonchi or rales.  Chest:     Chest wall: No tenderness.  Abdominal:     General: There is no distension.     Palpations: Abdomen is soft. There is no mass.     Tenderness: There is abdominal tenderness. There is no right CVA tenderness, left CVA tenderness, guarding or rebound.     Hernia: No hernia is present.     Comments: Mild tenderness to palpation of the left lower quadrant.  She does turn her head with palpation of the epigastric region, but does not grimace, groan, or push my hands away.  Abdomen is soft and nondistended.  No peritoneal signs.  No focal tenderness over McBurney's point.  Negative  Murphy sign.  No CVA tenderness bilaterally.  Normoactive bowel sounds.  Musculoskeletal:     Cervical back: Neck supple.     Right lower leg: No edema.     Left lower leg: No edema.  Lymphadenopathy:     Cervical: No cervical adenopathy.  Skin:    General: Skin is warm.     Coloration: Skin is not jaundiced.     Findings: No rash.  Neurological:     Mental Status: She is alert.  Psychiatric:        Behavior: Behavior normal.     ED Results / Procedures / Treatments   Labs (all labs  ordered are listed, but only abnormal results are displayed) Labs Reviewed  COMPREHENSIVE METABOLIC PANEL - Abnormal; Notable for the following components:      Result Value   Sodium 133 (*)    Chloride 97 (*)    Glucose, Bld 212 (*)    Alkaline Phosphatase 725 (*)    Total Bilirubin 1.8 (*)    All other components within normal limits  URINALYSIS, ROUTINE W REFLEX MICROSCOPIC - Abnormal; Notable for the following components:   Color, Urine AMBER (*)    APPearance HAZY (*)    Specific Gravity, Urine 1.032 (*)    Glucose, UA 50 (*)    Hgb urine dipstick LARGE (*)    Ketones, ur 80 (*)    Protein, ur 100 (*)    Bacteria, UA FEW (*)    All other components within normal limits  LIPASE, BLOOD  CBC  I-STAT BETA HCG BLOOD, ED ([facility], [facility], [facility] ONLY)    EKG None  Radiology CT ABDOMEN PELVIS W CONTRAST  Result Date: 09/16/2019 CLINICAL DATA:  Abdominal pain and fever EXAM: CT ABDOMEN AND PELVIS WITH CONTRAST TECHNIQUE: Multidetector CT imaging of the abdomen and pelvis was performed using the standard protocol following bolus administration of intravenous contrast. CONTRAST:  61mL OMNIPAQUE IOHEXOL 300 MG/ML  SOLN COMPARISON:  None. FINDINGS: LOWER CHEST: Normal. HEPATOBILIARY: Normal hepatic contours. No intra- or extrahepatic biliary dilatation. The gallbladder is normal. PANCREAS: Normal pancreas. No ductal dilatation or peripancreatic fluid collection. SPLEEN: Normal. ADRENALS/URINARY TRACT: The  adrenal glands are normal. No hydronephrosis, nephroureterolithiasis or solid renal mass. The urinary bladder is normal for degree of distention STOMACH/BOWEL: There is no hiatal hernia. Normal duodenal course and caliber. No small bowel dilatation or inflammation. No focal colonic abnormality. Normal appendix. VASCULAR/LYMPHATIC: Normal course and caliber of the major abdominal vessels. No abdominal or pelvic lymphadenopathy. REPRODUCTIVE: There is a T-shaped contraceptive device within the uterus. MUSCULOSKELETAL. Diffusely sclerotic appearance of the skeleton. OTHER: None. IMPRESSION: 1. No acute abnormality of the abdomen or pelvis. 2. Diffusely sclerotic appearance of the skeleton, possibly renal osteodystrophy or hyperparathyroidism. Electronically Signed   By: Deatra Robinson M.D.   On: 09/16/2019 01:54    Procedures Procedures (including critical care time)  Medications Ordered in ED Medications  sodium chloride flush (NS) 0.9 % injection 3 mL (3 mLs Intravenous Not Given 09/15/19 2222)  sodium chloride (PF) 0.9 % injection (has no administration in time range)  acetaminophen (TYLENOL) 160 MG/5ML solution 650 mg (650 mg Oral Given 09/16/19 0028)  sodium chloride 0.9 % bolus 1,000 mL (0 mLs Intravenous Stopped 09/16/19 0258)  ondansetron (ZOFRAN) injection 4 mg (4 mg Intravenous Given 09/16/19 0027)  iohexol (OMNIPAQUE) 300 MG/ML solution 100 mL (80 mLs Intravenous Contrast Given 09/16/19 0109)  insulin glargine (LANTUS) injection 9 Units (9 Units Subcutaneous Given 09/16/19 0258)    ED Course  I have reviewed the triage vital signs and the nursing notes.  Pertinent labs & imaging results that were available during my care of the patient were reviewed by me and considered in my medical decision making (see chart for details).    MDM Rules/Calculators/A&P                          19 year old female who is nonverbal with a h/o Type 1 DM and Vitamin D deficiency and is accompanied to the emergency  department by her mother with fever, abdominal pain, and nausea.  Patient was seen earlier  in the day at urgent care and had a negative urinalysis, COVID-19, and influenza test.  Febrile and tachycardic on arrival to the ER, but she is nontoxic-appearing.  Vital signs are otherwise unremarkable.  She has no leukocytosis.  Glucose is elevated, but bicarb and anion gap are normal and there is no evidence of DKA.  She does appear dehydrated with elevated specific gravity on her UA, which has been treated with IV fluids.  UA does not appear infectious.  Alkaline phosphatase is elevated, but this is chronic and stable.  Total bilirubin is elevated at 1.8, but transaminases are otherwise unremarkable and lipase is normal.  She does have some left lower quadrant tenderness and epigastric tenderness on abdominal exam, but exam is somewhat limited as patient is nonverbal.  There is no active grimacing or pushing my hands away on her exam.  CT abdomen pelvis was obtained and was negative for acute findings, including appendicitis, cholecystitis.  IUD was noted to be appropriately placed.  There was noted to be a sclerotic appearance to the skeleton, which I discussed with the patient's mother, and have contacted the patient's endocrinology team to have them follow-up on this finding in the clinic.  Suspect viral etiology of the patient's symptoms.  Repeat COVID-19 test was offered, but family declined.  Patient was successfully fluid challenged in the ER.  Will discharge home with Zofran and supportive care.  ER return precautions given and family was counseled especially on signs and symptoms of DKA, which should warrant prompt return.  She is hemodynamically stable and in no acute distress.  Safe for discharge to home with outpatient follow-up as indicated.  Final Clinical Impression(s) / ED Diagnoses Final diagnoses:  Acute generalized abdominal pain with fever    Rx / DC Orders ED Discharge Orders          Ordered    ondansetron [facility](ZOFRAN) 4 MG/5ML solution  Every 8 hours PRN     Discontinue  Reprint     09/16/19 0320    ibuprofen (ADVIL) 100 MG/5ML suspension  Every 6 hours PRN     Discontinue  Reprint     09/16/19 0320    acetaminophen (TYLENOL CHILDRENS) 160 MG/5ML suspension  Every 6 hours PRN     Discontinue  Reprint     09/16/19 0320           Williette Loewe A, PA-C 09/16/19 [id number]    Shon BatonHorton, Courtney F, MD 09/16/19 [phone]

## 2019-09-28 LAB — VITAMIN D 1,25 DIHYDROXY
Vitamin D 1, 25 (OH)2 Total: 65 pg/mL (ref 18–72)
Vitamin D2 1, 25 (OH)2: <8 pg/mL
Vitamin D3 1, 25 (OH)2: 65 pg/mL

## 2019-09-28 LAB — ALKALINE PHOSPHATASE ISOENZYMES
Alkaline phosphatase (APISO): 722 U/L — ABNORMAL HIGH (ref 36–128)
Bone Isoenzymes: 81 % — ABNORMAL HIGH (ref 28–66)
Intestinal Isoenzymes: 0 % — ABNORMAL LOW (ref 1–24)
Liver Isoenzymes: 19 % — ABNORMAL LOW (ref 25–69)
Macrohepatic isoenzymes: 0 % (ref <=0)
Placental isoenzymes: 0 % (ref <=0)

## 2019-09-28 LAB — MAGNESIUM: Magnesium: 2 mg/dL (ref 1.5–2.5)

## 2019-09-28 LAB — PTH, INTACT AND CALCIUM
Calcium: 9.5 mg/dL (ref 8.9–10.4)
PTH: 53 pg/mL (ref 14–64)

## 2019-09-28 LAB — PHOSPHORUS: Phosphorus: 4.4 mg/dL (ref 2.5–4.5)

## 2019-09-28 LAB — VITAMIN D 25 HYDROXY (VIT D DEFICIENCY, FRACTURES): Vit D, 25-Hydroxy: 35 ng/mL (ref 30–100)

## 2019-10-07 ENCOUNTER — Encounter (INDEPENDENT_AMBULATORY_CARE_PROVIDER_SITE_OTHER): Payer: Self-pay

## 2019-10-07 ENCOUNTER — Ambulatory Visit (INDEPENDENT_AMBULATORY_CARE_PROVIDER_SITE_OTHER): Payer: Medicaid Other | Admitting: Family Medicine

## 2019-10-07 ENCOUNTER — Encounter: Payer: Self-pay | Admitting: Family Medicine

## 2019-10-07 ENCOUNTER — Other Ambulatory Visit: Payer: Self-pay

## 2019-10-07 VITALS — BP 126/69 | HR 103 | Temp 97.8°F | Ht 60.5 in | Wt 138.4 lb

## 2019-10-07 DIAGNOSIS — Z00121 Encounter for routine child health examination with abnormal findings: Secondary | ICD-10-CM

## 2019-10-07 DIAGNOSIS — R6250 Unspecified lack of expected normal physiological development in childhood: Secondary | ICD-10-CM

## 2019-10-07 DIAGNOSIS — E109 Type 1 diabetes mellitus without complications: Secondary | ICD-10-CM

## 2019-10-07 DIAGNOSIS — Z0000 Encounter for general adult medical examination without abnormal findings: Secondary | ICD-10-CM

## 2019-10-07 NOTE — Patient Instructions (Signed)
Preventive Care 18-21 Years Old, Female Preventive care refers to lifestyle choices and visits with your health care provider that can promote health and wellness. At this stage in your life, you may start seeing a primary care physician instead of a pediatrician. Your health care is now your responsibility. Preventive care for young adults includes:  A yearly physical exam. This is also called an annual wellness visit.  Regular dental and eye exams.  Immunizations.  Screening for certain conditions.  Healthy lifestyle choices, such as diet and exercise. What can I expect for my preventive care visit? Physical exam Your health care provider may check:  Height and weight. These may be used to calculate body mass index (BMI), which is a measurement that tells if you are at a healthy weight.  Heart rate and blood pressure.  Body temperature. Counseling Your health care provider may ask you questions about:  Past medical problems and family medical history.  Alcohol, tobacco, and drug use.  Home and relationship well-being.  Access to firearms.  Emotional well-being.  Diet, exercise, and sleep habits.  Sexual activity and sexual health.  Method of birth control.  Menstrual cycle.  Pregnancy history. What immunizations do I need?  Influenza (flu) vaccine  This is recommended every year. Tetanus, diphtheria, and pertussis (Tdap) vaccine  You may need a Td booster every 10 years. Varicella (chickenpox) vaccine  You may need this vaccine if you have not already been vaccinated. Human papillomavirus (HPV) vaccine  If recommended by your health care provider, you may need three doses over 6 months. Measles, mumps, and rubella (MMR) vaccine  You may need at least one dose of MMR. You may also need a second dose. Meningococcal conjugate (MenACWY) vaccine  One dose is recommended if you are 19-21 years old and a first-year college student living in a residence hall,  or if you have one of several medical conditions. You may also need additional booster doses. Pneumococcal conjugate (PCV13) vaccine  You may need this if you have certain conditions and were not previously vaccinated. Pneumococcal polysaccharide (PPSV23) vaccine  You may need one or two doses if you smoke cigarettes or if you have certain conditions. Hepatitis A vaccine  You may need this if you have certain conditions or if you travel or work in places where you may be exposed to hepatitis A. Hepatitis B vaccine  You may need this if you have certain conditions or if you travel or work in places where you may be exposed to hepatitis B. Haemophilus influenzae type b (Hib) vaccine  You may need this if you have certain risk factors. You may receive vaccines as individual doses or as more than one vaccine together in one shot (combination vaccines). Talk with your health care provider about the risks and benefits of combination vaccines. What tests do I need? Blood tests  Lipid and cholesterol levels. These may be checked every 5 years starting at age 20.  Hepatitis C test.  Hepatitis B test. Screening  Pelvic exam and Pap test. This may be done every 3 years starting at age 21.  Sexually transmitted disease (STD) testing, if you are at risk.  BRCA-related cancer screening. This may be done if you have a family history of breast, ovarian, tubal, or peritoneal cancers. Other tests  Tuberculosis skin test.  Vision and hearing tests.  Skin exam.  Breast exam. Follow these instructions at home: Eating and drinking   Eat a diet that includes fresh fruits and   vegetables, whole grains, lean protein, and low-fat dairy products.  Drink enough fluid to keep your urine pale yellow.  Do not drink alcohol if: ? Your health care provider tells you not to drink. ? You are pregnant, may be pregnant, or are planning to become pregnant. ? You are under the legal drinking age. In the  [place] the legal drinking age is 36.  If you drink alcohol: ? Limit how much you have to 0-1 drink a day. ? Be aware of how much alcohol is in your drink. In the [place] one drink equals one 12 oz bottle of beer (355 mL), one 5 oz glass of wine (148 mL), or one 1 oz glass of hard liquor (44 mL). Lifestyle  Take daily care of your teeth and gums.  Stay active. Exercise at least 30 minutes 5 or more days of the week.  Do not use any products that contain nicotine or tobacco, such as cigarettes, e-cigarettes, and chewing tobacco. If you need help quitting, ask your health care provider.  Do not use drugs.  If you are sexually active, practice safe sex. Use a condom or other form of birth control (contraception) in order to prevent pregnancy and STIs (sexually transmitted infections). If you plan to become pregnant, see your health care provider for a pre-conception visit.  Find healthy ways to cope with stress, such as: ? Meditation, yoga, or listening to music. ? Journaling. ? Talking to a trusted person. ? Spending time with friends and family. Safety  Always wear your seat belt while driving or riding in a vehicle.  Do not drive if you have been drinking alcohol. Do not ride with someone who has been drinking.  Do not drive when you are tired or distracted. Do not text while driving.  Wear a helmet and other protective equipment during sports activities.  If you have firearms in your house, make sure you follow all gun safety procedures.  Seek help if you have been bullied, physically abused, or sexually abused.  Use the Internet responsibly to avoid dangers such as online bullying and online sex predators. What's next?  Go to your health care provider once a year for a well check visit.  Ask your health care provider how often you should have your eyes and teeth checked.  Stay up to date on all vaccines. This information is not intended to replace advice given to you by  your health care provider. Make sure you discuss any questions you have with your health care provider. Document Revised: 02/26/2018 Document Reviewed: 02/26/2018 Elsevier Patient Education  2020 [employer].

## 2019-10-07 NOTE — Progress Notes (Signed)
New Patient Office Visit  Assessment & Plan:  1. Well adult exam - Preventive health education provided.   2. Type 1 diabetes mellitus without complication (HCC) - Managed by endocrinology  3. Developmental delay - Mother is caregiver and legal guardian. Continue therapy.    Follow-up: Return in about 1 year (around 10/06/2020) for annual physical.   Deliah Boston, MSN, APRN, FNP-C [facility]  Subjective:  Patient ID: Melanie Morrow, female    DOB: 2000/12/29  Age: 19 y.o. MRN: [id number]  Patient Care Team: Gwenlyn Fudge, FNP as PCP - General (Family Medicine) Gretchen Short, NP as Consulting Physician (Endocrinology)  CC:  Chief Complaint  Patient presents with  . New Patient (Initial Visit)    [facility]  . Establish Care    HPI Melanie Morrow presents to establish care.   Patient is accompanied by her mom who is her legal guardian.  Patient has no specific diagnoses besides developmental delay.  She is nonverbal.  She does do horseback riding therapy at [facility].  She did have a speech therapist that was coming once weekly prior to COVID-19.  Mom is working on getting this started back up.  Patient does attend [employer] and is in the [facility] program.   Review of Systems  Unable to perform ROS: Patient nonverbal    Current Outpatient Medications:  .  acetaminophen (TYLENOL CHILDRENS) 160 MG/5ML suspension, Take 20.3 mLs (650 mg total) by mouth every 6 (six) hours as needed for moderate pain or fever., Disp: 355 mL, Rfl: 0 .  Alcohol Swabs (ALCOHOL PADS) 70 % PADS, 6 wipes per day, Disp: 200 each, Rfl: 6 .  Continuous Blood Gluc Sensor (DEXCOM G6 SENSOR) MISC, 1 Units by Does not apply route as directed. Change sensor every 10 days, Disp: 3 each, Rfl: 5 .  Continuous Blood Gluc Transmit (DEXCOM G6 TRANSMITTER) MISC, 1 Units by Does not apply route every 3 (three) months., Disp: 1 each, Rfl: 1 .  ibuprofen  (ADVIL) 100 MG/5ML suspension, Take 20 mLs (400 mg total) by mouth every 6 (six) hours as needed for fever., Disp: 473 mL, Rfl: 0 .  Insulin Pen Needle 32G X 4 MM MISC, Use to inject insulin 6x daily, Disp: 200 each, Rfl: 5 .  LANTUS SOLOSTAR 100 UNIT/ML Solostar Pen, USE UP TO 50 UNITS DAILY (Patient taking differently: Inject 9 Units into the skin at bedtime. ), Disp: 15 mL, Rfl: 5 .  levonorgestrel (MIRENA) 20 MCG/24HR IUD, 1 each by Intrauterine route once., Disp: , Rfl:  .  NOVOLOG FLEXPEN 100 UNIT/ML FlexPen, INJECT UP TO 50 UNITS OF INSULIN PER DAY. (Patient taking differently: Inject 2-13 Units into the skin See admin instructions. Three times a day on sliding scale), Disp: 15 mL, Rfl: 5 .  glucagon 1 MG injection, Follow package directions for low blood sugar., Disp: 2 each, Rfl: 6  Allergies  Allergen Reactions  . Penicillins Hives  . Amoxicillin Hives and Rash    Past Medical History:  Diagnosis Date  . Cognitive impairment   . Development delay   . Dyspraxia   . Non-verbal learning disorder   . Type 1 diabetes mellitus (HCC) 08/22/2016   +GAD Ab, neg insulin Ab and ICA Ab    Past Surgical History:  Procedure Laterality Date  . DILATION AND CURETTAGE, DIAGNOSTIC / THERAPEUTIC    . INTRAUTERINE DEVICE (IUD) INSERTION      Family History  Problem Relation Age of Onset  .  Diabetes Mother   . Hyperlipidemia Mother   . Diabetes Maternal Uncle   . Diabetes Maternal Grandfather   . Dementia Maternal Grandfather   . Hyperlipidemia Maternal Grandfather   . Hypertension Maternal Grandfather     Social History   Socioeconomic History  . Marital status: Single    Spouse name: Not on file  . Number of children: Not on file  . Years of education: Not on file  . Highest education level: Not on file  Occupational History  . Not on file  Tobacco Use  . Smoking status: Never Smoker  . Smokeless tobacco: Never Used  Vaping Use  . Vaping Use: Never used  Substance and  Sexual Activity  . Alcohol use: Never  . Drug use: Never  . Sexual activity: Never    Birth control/protection: I.U.D.  Other Topics Concern  . Not on file  Social History Narrative   Lives with parents. Stays with both MGM and PGM/PGF during summer days and after school.   Social Determinants of Health   Financial Resource Strain:   . Difficulty of Paying Living Expenses:   Food Insecurity:   . Worried About Programme researcher, broadcasting/film/video in the Last Year:   . Barista in the Last Year:   Transportation Needs:   . Freight forwarder (Medical):   Marland Kitchen Lack of Transportation (Non-Medical):   Physical Activity:   . Days of Exercise per Week:   . Minutes of Exercise per Session:   Stress:   . Feeling of Stress :   Social Connections:   . Frequency of Communication with Friends and Family:   . Frequency of Social Gatherings with Friends and Family:   . Attends Religious Services:   . Active Member of Clubs or Organizations:   . Attends Banker Meetings:   Marland Kitchen Marital Status:   Intimate Partner Violence:   . Fear of Current or Ex-Partner:   . Emotionally Abused:   Marland Kitchen Physically Abused:   . Sexually Abused:     Objective:   Today's Vitals: BP 126/69   Pulse (!) 103   Temp 97.8 F (36.6 C) (Temporal)   Ht 5' 0.5" (1.537 m)   Wt 138 lb 6.4 oz (62.8 kg)   SpO2 98%   BMI 26.58 kg/m   Physical Exam Vitals reviewed.  Constitutional:      General: She is not in acute distress.    Appearance: Normal appearance. She is overweight. She is not ill-appearing, toxic-appearing or diaphoretic.  HENT:     Head: Normocephalic and atraumatic.     Right Ear: Tympanic membrane, ear canal and external ear normal. There is no impacted cerumen.     Left Ear: Tympanic membrane, ear canal and external ear normal. There is no impacted cerumen.     Nose: Nose normal. No congestion or rhinorrhea.     Mouth/Throat:     Mouth: Mucous membranes are moist.     Pharynx: Oropharynx is  clear. No oropharyngeal exudate or posterior oropharyngeal erythema.  Eyes:     General: No scleral icterus.       Right eye: No discharge.        Left eye: No discharge.     Conjunctiva/sclera: Conjunctivae normal.     Pupils: Pupils are equal, round, and reactive to light.  Cardiovascular:     Rate and Rhythm: Normal rate and regular rhythm.     Heart sounds: Normal heart sounds. No murmur heard.  No friction rub. No gallop.   Pulmonary:     Effort: Pulmonary effort is normal. No respiratory distress.     Breath sounds: Normal breath sounds. No stridor. No wheezing, rhonchi or rales.  Abdominal:     General: Abdomen is flat. Bowel sounds are normal. There is no distension.     Palpations: Abdomen is soft. There is no mass.     Tenderness: There is no abdominal tenderness. There is no guarding or rebound.     Hernia: No hernia is present.  Musculoskeletal:        General: Normal range of motion.     Cervical back: Normal range of motion and neck supple. No rigidity. No muscular tenderness.  Lymphadenopathy:     Cervical: No cervical adenopathy.  Skin:    General: Skin is warm and dry.     Capillary Refill: Capillary refill takes less than 2 seconds.  Neurological:     General: No focal deficit present.     Mental Status: She is alert and oriented to person, place, and time. Mental status is at baseline.  Psychiatric:        Mood and Affect: Mood normal.        Behavior: Behavior normal.        Thought Content: Thought content normal.        Judgment: Judgment normal.

## 2019-10-11 ENCOUNTER — Encounter: Payer: Self-pay | Admitting: Family Medicine

## 2019-10-16 ENCOUNTER — Other Ambulatory Visit (INDEPENDENT_AMBULATORY_CARE_PROVIDER_SITE_OTHER): Payer: Self-pay | Admitting: Family

## 2019-10-18 ENCOUNTER — Telehealth (INDEPENDENT_AMBULATORY_CARE_PROVIDER_SITE_OTHER): Payer: Self-pay

## 2019-10-18 NOTE — Telephone Encounter (Signed)
Received indication from pharmacy that PA was required for Dexcom G6 supplies.  PA initiated through Covermymeds.

## 2019-10-19 ENCOUNTER — Encounter (INDEPENDENT_AMBULATORY_CARE_PROVIDER_SITE_OTHER): Payer: Self-pay

## 2019-10-19 MED ORDER — DEXCOM G6 TRANSMITTER MISC: 1 refills | Status: DC

## 2019-10-19 MED ORDER — DEXCOM G6 SENSOR MISC: 5 refills | Status: DC

## 2019-10-20 ENCOUNTER — Telehealth (INDEPENDENT_AMBULATORY_CARE_PROVIDER_SITE_OTHER): Payer: Self-pay | Admitting: Family

## 2019-10-20 NOTE — Telephone Encounter (Signed)
Left HIPAA compliant voicemail letting mom know authorization has been sent, and we are awaiting notification. If they need a sensor and transmitter before authorization is received they can come pick one up at our office.

## 2019-10-20 NOTE — Telephone Encounter (Signed)
Who's calling (name and relationship to patient) : Arlice Colt mom   Best contact number: [phone]  Provider they see: Gretchen Short  Reason for call: Needs a prior auth for dexcom transmitter and sensor  Call ID:      PRESCRIPTION REFILL ONLY  Name of prescription:  Pharmacy:

## 2019-10-21 NOTE — Telephone Encounter (Signed)
Mom Delice Bison) has called back - states that insurance is telling her that we submitted prior auth on an incorrect form and that they need additional information detailing why Dexcom benefits patient. Also states that patient has appointment next Tuesday and she would like to pick up sensors and transmitter from [name] at that time. Her call back number is [phone].

## 2019-10-26 ENCOUNTER — Other Ambulatory Visit: Payer: Self-pay

## 2019-10-26 ENCOUNTER — Encounter (INDEPENDENT_AMBULATORY_CARE_PROVIDER_SITE_OTHER): Payer: Self-pay | Admitting: Family

## 2019-10-26 ENCOUNTER — Ambulatory Visit (INDEPENDENT_AMBULATORY_CARE_PROVIDER_SITE_OTHER): Payer: Medicaid Other | Admitting: Family

## 2019-10-26 VITALS — BP 118/76 | HR 88 | Ht 60.67 in | Wt 137.0 lb

## 2019-10-26 DIAGNOSIS — R748 Abnormal levels of other serum enzymes: Secondary | ICD-10-CM | POA: Diagnosis not present

## 2019-10-26 DIAGNOSIS — E10649 Type 1 diabetes mellitus with hypoglycemia without coma: Secondary | ICD-10-CM

## 2019-10-26 DIAGNOSIS — E109 Type 1 diabetes mellitus without complications: Secondary | ICD-10-CM | POA: Diagnosis not present

## 2019-10-26 DIAGNOSIS — R739 Hyperglycemia, unspecified: Secondary | ICD-10-CM | POA: Diagnosis not present

## 2019-10-26 DIAGNOSIS — R6250 Unspecified lack of expected normal physiological development in childhood: Secondary | ICD-10-CM

## 2019-10-26 LAB — POCT GLUCOSE (DEVICE FOR HOME USE): Glucose Fasting, POC: 136 mg/dL — AB (ref 70–99)

## 2019-10-26 LAB — POCT GLYCOSYLATED HEMOGLOBIN (HGB A1C): Hemoglobin A1C: 6.4 % — AB (ref 4.0–5.6)

## 2019-10-26 NOTE — Patient Instructions (Signed)
-  Always have fast sugar with you in case of low blood sugar (glucose tabs, regular juice or soda, candy) -Always wear your ID that states you have diabetes -Always bring your meter to your visit -Call/Email if you want to review blood sugars   

## 2019-10-26 NOTE — Progress Notes (Signed)
Pediatric Endocrinology Diabetes Consultation Follow-up Visit  Melanie Morrow 11-10-2000 [id number]  Chief Complaint: Follow-up type 1 diabetes   [name], FNP   HPI: Melanie Morrow  is a 19 y.o. female presenting for follow-up of type 1 diabetes. she is accompanied to this visit by her mother.  90. Melanie Morrow is a 50 y.o. with history of pervasive developmental disorder (also non-verbal) who was diagnosed with T1DM on 08/22/16 after presenting to her PCP with polyuria, polydipsia, weight loss and hyperglycemia.  At PCP's office she had BG of 238 with UA showing 2+ glucose and 2+ ketones.  She was admitted to [facility] pediatric floor where she was not in DKA.  She was started on an MDI regimen.  TFTs on admission were normal with negative ICA Ab and negative insulin Ab; GAD Ab were positive.  C-peptide was low at 1.9.  A1c was 10%.  2. Since last visit on 07/2018, she has been well.    Melanie Morrow has been busy this summer and will be going to the beach in a few weeks. Mom reports that blood sugars have been running higher overall, usually in the 160-200 range. She is wearing Dexcom CGm which is working well for her. [name] in nonverbal and unable to communicate when she is hypoglycemic.   She went to the ER on 09/2019 with abdominal pain. A CT was done which showed concern for diffuse sclerosis. [name] had labs done recently that continues to show elevated alk phos level, vitamin D is now normal. PTH was normal. Her bone izoenzymes were elevated. Her intestinal izoenzymes and liver isoenzymes were low. We have reach out to adult endocrinology for a consult with bone specialist.   Mom is concerned about Melanie Morrow going back to school due to Melanie Morrow 18. Their school system is not requiring mask. [name] is vaccinated and will plan to wear a mask. Mom may request virtual school.   Melanie Morrow is now taking a liquid Vitamin D supplement that has 2000 units per day. She takes 2 drop per day for a total of  4000 units per day   Insulin regimen: Lantus 10 units  . Novolog 150/50/15 half unit plan  Hypoglycemia: Few lows since last visit, no glucagon needed. CGM Download: Using dexcom G6   Med-alert ID: Wearing a new bracelet today Injection sites: arms, legs and abdomen. Rotating.  Uses arms for CGM  Annual labs due: 08/2019 Ophthalmology due: Not yet   ROS:  All systems reviewed with pertinent positives listed below; otherwise negative. Constitutional:Sleeping well. Weight stable.   Eyes; no vision changes. No blurry vision.  HENT: No difficulty swallowing. No neck pain  Respiratory: No increased work of breathing currently GI: No constipation or diarrhea GU: Occasional spotting. Has IUD.  Musculoskeletal: No joint deformity Neuro: Normal affect. No tremors or headaches.  Endocrine: As above    Past Medical History:   Past Medical History:  Diagnosis Date  . Cognitive impairment   . Development delay    Has been evaluated by genetics and has normal chromosomes  . Dyspraxia   . Non-verbal learning disorder   . Type 1 diabetes mellitus ([place]) 08/22/2016   +GAD Ab, neg insulin Ab and ICA Ab    Medications:  Outpatient Encounter Medications as of 10/26/2019  Medication Sig Note  . Alcohol Swabs (ALCOHOL PADS) 70 % PADS 6 wipes per day   . Continuous Blood Gluc Sensor (DEXCOM G6 SENSOR) MISC Change sensor every 10 days   . Continuous Blood Gluc Transmit (  DEXCOM G6 TRANSMITTER) MISC Use with Dexcom sensors   . Insulin Pen Needle 32G X 4 MM MISC Use to inject insulin 6x daily   . LANTUS SOLOSTAR 100 UNIT/ML Solostar Pen USE UP TO 50 UNITS DAILY (Patient taking differently: Inject 10 Units into the skin at bedtime. )   . levonorgestrel (MIRENA) 20 MCG/24HR IUD 1 each by Intrauterine route once.   . Norethin Ace-Eth Estrad-FE (CHARLOTTE 24 FE) 1-20 MG-MCG(24) CHEW Chew by mouth.   Marland Kitchen NOVOLOG FLEXPEN 100 UNIT/ML FlexPen INJECT UP TO 50 UNITS OF INSULIN PER DAY. (Patient taking  differently: Inject 2-13 Units into the skin See admin instructions. Three times a day on sliding scale) 09/16/2019: Pt got 5units   . acetaminophen (TYLENOL CHILDRENS) 160 MG/5ML suspension Take 20.3 mLs (650 mg total) by mouth every 6 (six) hours as needed for moderate pain or fever. (Patient not taking: Reported on 10/26/2019)   . glucagon 1 MG injection Follow package directions for low blood sugar.   . ibuprofen (ADVIL) 100 MG/5ML suspension Take 20 mLs (400 mg total) by mouth every 6 (six) hours as needed for fever. (Patient not taking: Reported on 10/26/2019)    No facility-administered encounter medications on file as of 10/26/2019.    Allergies: Allergies  Allergen Reactions  . Penicillins Hives  . Amoxicillin Hives and Rash    Surgical History: Past Surgical History:  Procedure Laterality Date  . DILATION AND CURETTAGE, DIAGNOSTIC / THERAPEUTIC    . INTRAUTERINE DEVICE (IUD) INSERTION      Family History:  Family History  Problem Relation Age of Onset  . Diabetes Mother   . Hyperlipidemia Mother   . Diabetes Maternal Uncle   . Diabetes Maternal Grandfather   . Dementia Maternal Grandfather   . Hyperlipidemia Maternal Grandfather   . Hypertension Maternal Grandfather   . Hypertension Paternal Grandmother    No family history of T1DM or hypothyroidism   Social History: Lives with: parents, grandparents also involved in her care Going into 12th grade, mother happy with care provided by school. She will have the same teacher this year as last.  Physical Exam:  Vitals:   10/26/19 0845  BP: 118/76  Pulse: 88  Weight: 137 lb (62.1 kg)  Height: 5' 0.67" (1.541 m)   BP 118/76   Pulse 88   Ht 5' 0.67" (1.541 m)   Wt 137 lb (62.1 kg)   LMP  (LMP Unknown)   BMI 26.17 kg/m  Body mass index: body mass index is 26.17 kg/m. Blood pressure percentiles are not available for patients who are 18 years or older.  Ht Readings from Last 3 Encounters:  10/26/19 5' 0.67"  (1.541 m) (8 %, Z= -1.42)*  10/07/19 5' 0.5" (1.537 m) (7 %, Z= -1.49)*  07/22/19 5' 0.63" (1.54 m) (8 %, Z= -1.43)*   * Growth percentiles are based on CDC (Girls, 2-20 Years) data.   Wt Readings from Last 3 Encounters:  10/26/19 137 lb (62.1 kg) (65 %, Z= 0.39)*  10/07/19 138 lb 6.4 oz (62.8 kg) (67 %, Z= 0.45)*  09/15/19 135 lb (61.2 kg) (62 %, Z= 0.32)*   * Growth percentiles are based on CDC (Girls, 2-20 Years) data.   General: Well developed, well nourished female in no acute distress.  Head: Normocephalic, atraumatic.   Eyes:  Pupils equal and round. EOMI.   Sclera white.  No eye drainage.   Ears/Nose/Mouth/Throat: Nares patent, no nasal drainage.  Normal dentition, mucous membranes moist.  Neck: supple, no cervical lymphadenopathy, no thyromegaly Cardiovascular: regular rate, normal S1/S2, no murmurs Respiratory: No increased work of breathing.  Lungs clear to auscultation bilaterally.  No wheezes. Abdomen: soft, nontender, nondistended. Normal bowel sounds.  No appreciable masses  Extremities: warm, well perfused, cap refill < 2 sec.   Musculoskeletal: Normal muscle mass.  Normal strength Skin: warm, dry.  No rash or lesions.  Neurologic: alert and oriented, nonverbal. + developmental delay, no tremor    Labs:   Results for orders placed or performed in visit on 10/26/19  POCT Glucose (Device for Home Use)  Result Value Ref Range   Glucose Fasting, POC 136 (A) 70 - 99 mg/dL   POC Glucose    POCT glycosylated hemoglobin (Hb A1C)  Result Value Ref Range   Hemoglobin A1C 6.4 (A) 4.0 - 5.6 %   HbA1c POC (<> result, manual entry)     HbA1c, POC (prediabetic range)     HbA1c, POC (controlled diabetic range)       Assessment/Plan: Meloni is a 19 y.o. female with controlled type 1 diabetes on MDI and Dexcom CGM. She has very good diabetes control on MDI. Her hemoglobin A1c is 6.4% which meets the ADA goal of <7.5%. Currently taking vitamin D daily. In discussion with  adult endocrinology about CT and bone labs, will refer to bone specialist.    1-4. Controlled diabetes mellitus type 1 without complications (HCC)/Adjustment reaction/Developmental delay/hypoglycemia unawareness - 10 units of lantus  - Start Novolog 150/50/12 plan  - Reviewed meter and CGM download. Discussed trends and patterns.  - Rotate injection sites to prevent scar tissue.  - Reviewed carb counting and importance of accurate carb counting.  - Discussed signs and symptoms of hypoglycemia. Always have glucose available.  - POCT glucose and hemoglobin A1c  - Reviewed growth chart.    5. Elevated alkaline phosphatase  - Continue vitmain D supplement (Super daily Vitamin D)  - Discussing case with adult endocrinology with help from Dr. Charna Archer. Possible referral for bone eval in future.     Follow-up:  3 months.    >45 spent today reviewing the medical chart, counseling the patient/family, and documenting today's visit.  When a patient is on insulin, intensive monitoring of blood glucose levels is necessary to avoid hyperglycemia and hypoglycemia. Severe hyperglycemia/hypoglycemia can lead to hospital admissions and be life threatening.      Hermenia Bers,  FNP-C  Pediatric Specialist  [address] [place]  [place], [address]  Tele: [phone]

## 2019-10-26 NOTE — Progress Notes (Signed)
[facility]  [address], [address] [place], [place] [address] Telephone [phone]     Fax [phone]          Rapid-Acting Insulin Instructions (Novolog/Humalog/Apidra) (Target blood sugar 150, Insulin Sensitivity Factor 50, Insulin to Carbohydrate Ratio 1 unit for 12g)   SECTION A (Meals): 1. At mealtimes, take rapid-acting insulin according to this "Two-Component Method".  a. Measure Fingerstick Blood Glucose (or use reading on continuous glucose monitor) 0-15 minutes prior to the meal. Use the "Correction Dose Table" below to determine the dose of rapid-acting insulin needed to bring your blood sugar down to a baseline of 150. You can also calculate this dose with the following equation: (Blood sugar - target blood sugar) divided by 50.  Correction Dose Table    Blood Sugar Rapid-acting Insulin units  Blood Sugar Rapid-acting Insulin units  < 100 (-) 1  351-400 5  101-150 0  401-450 6  151-200 1  451-500 7  201-250 2  501-550 8  251-300 3  551-600 9  301-350 4  Hi (>600) 10   b. Estimate the number of grams of carbohydrates you will be eating (carb count). Use the "Food Dose Table" below to determine the dose of rapid-acting insulin needed to cover the carbs in the meal. You can also calculate this dose using this formula: Total carbs divided by 12.  Food Dose Table Grams of Carbs Rapid-acting Insulin units  Grams of Carbs Rapid-acting Insulin units  0-8 0  73-84 7  8-12 1  85-96 8  13-24 2  97-108 9  25-36 3  109-120 10  37-48 4  121-132 11  49-60 5  133-144 12  61-72 6  145-156 13   c. Add up the Correction Dose plus the Food Dose = "Total Dose" of rapid-acting insulin to be taken. d. If you know the number of carbs you will eat, take the rapid-acting insulin 0-15 minutes prior to the meal; otherwise take the insulin immediately after the meal.    SECTION B (Bedtime/2AM): 1. Wait at least 2.5-3 hours after taking your supper  rapid-acting insulin before you do your bedtime blood sugar test. Based on your blood sugar, take a "bedtime snack" according to the table below. These carbs are "Free". You don't have to cover those carbs with rapid-acting insulin.  If you want a snack with more carbs than the "bedtime snack" table allows, subtract the free carbs from the total amount of carbs in the snack and cover this carb amount with rapid-acting insulin based on the Food Dose Table from Page 1.  Use the following column for your bedtime snack: ___________________  Bedtime Carbohydrate Snack Table  Blood Sugar Large Medium Small Very Small  < 76         60 gms         50 gms         40 gms    30 gms       76-100         50 gms         40 gms         30 gms    20 gms     101-150         40 gms         30 gms         20 gms    10 gms     151-199           30 gms         20gms                       10 gms      0    200-250         20 gms         10 gms           0      0    251-300         10 gms           0           0      0      > 300           0           0                    0      0   2. If the blood sugar at bedtime is above 200, no snack is needed (though if you do want a snack, cover the entire amount of carbs based on the Food Dose Table on page 1). You will need to take additional rapid-acting insulin based on the Bedtime Sliding Scale Dose Table below.  Bedtime Sliding Scale Dose Table  Blood Sugar Rapid-acting Insulin units  <200 0  201-250 1  251-300 2  301-350 3  351-400 4  401-450 5  451-500 6  > 500 7   3. Then take your usual dose of long-acting insulin (Lantus, Basaglar, Tresiba).  4. If we ask you to check your blood sugar in the middle of the night (2AM-3AM), you should wait at least 3 hours after your last rapid-acting insulin dose before you check the blood sugar.  You will then use the Bedtime Sliding Scale Dose Table to give additional units of rapid-acting insulin if blood sugar is above 200.  This may be especially necessary in times of sickness, when the illness may cause more resistance to insulin and higher blood sugar than usual.  Michael Brennan, MD, CDE Signature: _____________________________________ Jennifer Badik, MD   Ashley Jessup, MD    Khole Branch, NP  Date: ______________  

## 2019-10-26 NOTE — Telephone Encounter (Signed)
Called pharmacy to follow up, it came back the PA is still in process.

## 2019-10-27 NOTE — Telephone Encounter (Signed)
Received letter from [facility], progress notes were needed for review.  Progress notes faxed.

## 2019-10-31 ENCOUNTER — Encounter (INDEPENDENT_AMBULATORY_CARE_PROVIDER_SITE_OTHER): Payer: Self-pay

## 2019-11-01 ENCOUNTER — Encounter (INDEPENDENT_AMBULATORY_CARE_PROVIDER_SITE_OTHER): Payer: Self-pay

## 2019-11-02 NOTE — Addendum Note (Signed)
Addended by: Judene Companion on: 11/02/2019 11:11 AM   Modules accepted: Orders

## 2019-11-02 NOTE — Progress Notes (Addendum)
I have discussed this case in detail with Gretchen Short, NP.   Essentially, Melanie Morrow is a 18yo nonverbal, mobile female with T1DM who has had persistently elevated alkaline phosphatase (700-800 range) over the past several years, initially with vitamin D deficiency that has now been corrected. She presented to [facility] ED with abdominal pain and had an abdominal CT which showed "Diffusely sclerotic appearance of the skeleton, possibly renal osteodystrophy or hyperparathyroidism." Alkaline phosphatase remains elevated to the mid 700s (alkaline phosphatase isoenzymes 81% bone, 19% liver), normal PTH/Calcium/phosphorus/25-OH D/1,25 vitamin D/BUN/Cr/GFR>60.   Mom denies recent fractures or concerns of bone pain.  Discussed her case anonymously with my adult endocrinology colleague (Dr. Elvera Lennox), who recommended referral to Dr. Gwendolyn Grant with [name]. Will place referral.    Ref. Range 09/23/2019 16:24  Calcium Latest Ref Range: 8.9 - 10.4 mg/dL 9.5  Phosphorus Latest Ref Range: 2.5 - 4.5 mg/dL 4.4  Magnesium Latest Ref Range: 1.5 - 2.5 mg/dL 2.0  Alkaline phosphatase (APISO) Latest Ref Range: 36 - 128 U/L 722 (H)  Macrohepatic isoenzymes Latest Ref Range: <=0 % 0  Placental isoenzymes Latest Ref Range: <=0 % 0  Vitamin D, 25-Hydroxy Latest Ref Range: 30 - 100 ng/mL 35  Vitamin D 1, 25 (OH) Total Latest Ref Range: 18 - 72 pg/mL 65  Vitamin D2 1, 25 (OH) Latest Units: pg/mL <8  Vitamin D3 1, 25 (OH) Latest Units: pg/mL 65  PTH, Intact Latest Ref Range: 14 - 64 pg/mL 53  Bone Isoenzymes Latest Ref Range: 28 - 66 % 81 (H)  Intestinal Isoenzymes Latest Ref Range: 1 - 24 % 0 (L)  Liver Isoenzymes Latest Ref Range: 25 - 69 % 19 (L)   ---------------------------------------------------------------------------------- 09/16/2019: CT ABDOMEN AND PELVIS WITH CONTRAST  TECHNIQUE: Multidetector CT imaging of the abdomen and pelvis was performed using the standard protocol following bolus administration  of intravenous contrast.  CONTRAST:  107mL OMNIPAQUE IOHEXOL 300 MG/ML  SOLN  COMPARISON:  None.  FINDINGS: LOWER CHEST: Normal.  HEPATOBILIARY: Normal hepatic contours. No intra- or extrahepatic biliary dilatation. The gallbladder is normal.  PANCREAS: Normal pancreas. No ductal dilatation or peripancreatic fluid collection.  SPLEEN: Normal.  ADRENALS/URINARY TRACT: The adrenal glands are normal. No hydronephrosis, nephroureterolithiasis or solid renal mass. The urinary bladder is normal for degree of distention  STOMACH/BOWEL: There is no hiatal hernia. Normal duodenal course and caliber. No small bowel dilatation or inflammation. No focal colonic abnormality. Normal appendix.  VASCULAR/LYMPHATIC: Normal course and caliber of the major abdominal vessels. No abdominal or pelvic lymphadenopathy.  REPRODUCTIVE: There is a T-shaped contraceptive device within the uterus.  MUSCULOSKELETAL. Diffusely sclerotic appearance of the skeleton.  OTHER: None.  IMPRESSION: 1. No acute abnormality of the abdomen or pelvis. 2. Diffusely sclerotic appearance of the skeleton, possibly renal osteodystrophy or hyperparathyroidism.  -----------------------------------------------------------------  Casimiro Needle, MD

## 2019-11-05 NOTE — Telephone Encounter (Signed)
Progress notes faxed again to [facility]. Received success sheet at 16:28

## 2020-01-09 ENCOUNTER — Encounter (INDEPENDENT_AMBULATORY_CARE_PROVIDER_SITE_OTHER): Payer: Self-pay

## 2020-01-31 ENCOUNTER — Other Ambulatory Visit: Payer: Self-pay

## 2020-01-31 ENCOUNTER — Ambulatory Visit (INDEPENDENT_AMBULATORY_CARE_PROVIDER_SITE_OTHER): Payer: Medicaid Other | Admitting: Family

## 2020-01-31 ENCOUNTER — Encounter (INDEPENDENT_AMBULATORY_CARE_PROVIDER_SITE_OTHER): Payer: Self-pay | Admitting: Family

## 2020-01-31 VITALS — BP 122/78 | HR 84 | Wt 135.0 lb

## 2020-01-31 DIAGNOSIS — E1065 Type 1 diabetes mellitus with hyperglycemia: Secondary | ICD-10-CM

## 2020-01-31 DIAGNOSIS — Z794 Long term (current) use of insulin: Secondary | ICD-10-CM

## 2020-01-31 DIAGNOSIS — E559 Vitamin D deficiency, unspecified: Secondary | ICD-10-CM

## 2020-01-31 DIAGNOSIS — E10649 Type 1 diabetes mellitus with hypoglycemia without coma: Secondary | ICD-10-CM

## 2020-01-31 DIAGNOSIS — R6250 Unspecified lack of expected normal physiological development in childhood: Secondary | ICD-10-CM

## 2020-01-31 DIAGNOSIS — R739 Hyperglycemia, unspecified: Secondary | ICD-10-CM

## 2020-01-31 DIAGNOSIS — R748 Abnormal levels of other serum enzymes: Secondary | ICD-10-CM | POA: Diagnosis not present

## 2020-01-31 DIAGNOSIS — E109 Type 1 diabetes mellitus without complications: Secondary | ICD-10-CM

## 2020-01-31 LAB — POCT GLYCOSYLATED HEMOGLOBIN (HGB A1C): Hemoglobin A1C: 7 % — AB (ref 4.0–5.6)

## 2020-01-31 LAB — POCT GLUCOSE (DEVICE FOR HOME USE): Glucose Fasting, POC: 199 mg/dL — AB (ref 70–99)

## 2020-01-31 NOTE — Progress Notes (Signed)
Pediatric Endocrinology Diabetes Consultation Follow-up Visit  Melanie Morrow [birth year] [id number]  Chief Complaint: Follow-up type 1 diabetes   [name], FNP   HPI: Melanie Morrow  is a 19 y.o. female presenting for follow-up of type 1 diabetes. she is accompanied to this visit by her mother.  88. Melanie Morrow is a 35 y.o. with history of pervasive developmental disorder (also non-verbal) who was diagnosed with T1DM on 08/22/16 after presenting to her PCP with polyuria, polydipsia, weight loss and hyperglycemia.  At PCP's office she had BG of 238 with UA showing 2+ glucose and 2+ ketones.  She was admitted to [facility] pediatric floor where she was not in DKA.  She was started on an MDI regimen.  TFTs on admission were normal with negative ICA Ab and negative insulin Ab; GAD Ab were positive.  C-peptide was low at 1.9.  A1c was 10%.  2. Since last visit on 10/2018, she has been well.    Mom reports that Melanie Morrow got the booster shot of COVID vaccine in October, she handled it well. She is spending most of her free time with her grandparents and doing homebound school.   Mom reports that they made appointment for March 22nd to see Endocrinology at [facility] to examine her abnormal alk phos level.   Mom reports that her blood sugars have been running a little bit high lately (low 200 to mid 200's). Mainly around lunch time and dinner. She is not having any hypoglycemia. When she wakes up in the morning she is usually around 170-180.   Recently started birth control pills. She started having heavy periods and Melanie Morrow was not tolerating them well.   Melanie Morrow is now taking a liquid Vitamin D supplement that has 2000 units per day. She takes 2 drop per day for a total of 4000 units per day   Insulin regimen: Lantus 10 units  . Novolog 150/50/15 half unit plan  Hypoglycemia: Few lows since last visit, no glucagon needed. CGM Download: Using dexcom G6   Med-alert ID: Wearing a new bracelet  today Injection sites: arms, legs and abdomen. Rotating.  Uses arms for CGM  Annual labs due: 2022 Ophthalmology due: Not yet   ROS:  All systems reviewed with pertinent positives listed below; otherwise negative. Constitutional:Sleeping well. Weight stable.   Eyes; no vision changes. No blurry vision.  HENT: No difficulty swallowing. No neck pain  Respiratory: No increased work of breathing currently GI: No constipation or diarrhea GU: Occasional spotting. Has IUD.  Musculoskeletal: No joint deformity Neuro: Normal affect. No tremors or headaches.  Endocrine: As above    Past Medical History:   Past Medical History:  Diagnosis Date  . Cognitive impairment   . Development delay    Has been evaluated by genetics and has normal chromosomes  . Dyspraxia   . Non-verbal learning disorder   . Type 1 diabetes mellitus ([place]) 08/22/2016   +GAD Ab, neg insulin Ab and ICA Ab    Medications:  Outpatient Encounter Medications as of 01/31/2020  Medication Sig Note  . Continuous Blood Gluc Sensor (DEXCOM G6 SENSOR) MISC Change sensor every 10 days   . Continuous Blood Gluc Transmit (DEXCOM G6 TRANSMITTER) MISC Use with Dexcom sensors   . LANTUS SOLOSTAR 100 UNIT/ML Solostar Pen USE UP TO 50 UNITS DAILY (Patient taking differently: Inject 10 Units into the skin at bedtime. )   . levonorgestrel (MIRENA) 20 MCG/24HR IUD 1 each by Intrauterine route once.   . Norethin Ace-Eth Estrad-Morrow (  Melanie Morrow) 1-20 MG-MCG(24) CHEW Chew by mouth.   Marland Kitchen NOVOLOG FLEXPEN 100 UNIT/ML FlexPen INJECT UP TO 50 UNITS OF INSULIN PER DAY. (Patient taking differently: Inject 2-13 Units into the skin See admin instructions. Three times a day on sliding scale) 09/16/2019: Pt got 5units   . acetaminophen (TYLENOL CHILDRENS) 160 MG/5ML suspension Take 20.3 mLs (650 mg total) by mouth every 6 (six) hours as needed for moderate pain or fever. (Patient not taking: Reported on 10/26/2019)   . Alcohol Swabs (ALCOHOL PADS) 70 %  PADS 6 wipes per day   . glucagon 1 MG injection Follow package directions for low blood sugar.   . ibuprofen (ADVIL) 100 MG/5ML suspension Take 20 mLs (400 mg total) by mouth every 6 (six) hours as needed for fever. (Patient not taking: Reported on 10/26/2019)   . Insulin Pen Needle 32G X 4 MM MISC Use to inject insulin 6x daily    No facility-administered encounter medications on file as of 01/31/2020.    Allergies: Allergies  Allergen Reactions  . Penicillins Hives  . Amoxicillin Hives and Rash    Surgical History: Past Surgical History:  Procedure Laterality Date  . DILATION AND CURETTAGE, DIAGNOSTIC / THERAPEUTIC    . INTRAUTERINE DEVICE (IUD) INSERTION      Family History:  Family History  Problem Relation Age of Onset  . Diabetes Mother   . Hyperlipidemia Mother   . Diabetes Maternal Uncle   . Diabetes Maternal Grandfather   . Dementia Maternal Grandfather   . Hyperlipidemia Maternal Grandfather   . Hypertension Maternal Grandfather   . Hypertension Paternal Grandmother    No family history of T1DM or hypothyroidism   Social History: Lives with: parents, grandparents also involved in her care Going into 12th grade, doing online classes currently   Physical Exam:  Vitals:   01/31/20 0836  BP: 122/78  Pulse: 84  Weight: 135 lb (61.2 kg)   BP 122/78   Pulse 84   Wt 135 lb (61.2 kg)   BMI 25.79 kg/m  Body mass index: body mass index is 25.79 kg/m. Growth percentile SmartLinks can only be used for patients less than 72 years old.  Ht Readings from Last 3 Encounters:  10/26/19 5' 0.67" (1.541 m) (8 %, Z= -1.42)*  10/07/19 5' 0.5" (1.537 m) (7 %, Z= -1.49)*  07/22/19 5' 0.63" (1.54 m) (8 %, Z= -1.43)*   * Growth percentiles are based on CDC (Girls, 2-20 Years) data.   Wt Readings from Last 3 Encounters:  01/31/20 135 lb (61.2 kg)  10/26/19 137 lb (62.1 kg) (65 %, Z= 0.39)*  10/07/19 138 lb 6.4 oz (62.8 kg) (67 %, Z= 0.45)*   * Growth percentiles are  based on CDC (Girls, 2-20 Years) data.   General: Well developed, well nourished female in no acute distress.  Head: Normocephalic, atraumatic.   Eyes:  Pupils equal and round. EOMI.   Sclera white.  No eye drainage.   Ears/Nose/Mouth/Throat: Nares patent, no nasal drainage.  Normal dentition, mucous membranes moist.   Neck: supple, no cervical lymphadenopathy, no thyromegaly Cardiovascular: regular rate, normal S1/S2, no murmurs Respiratory: No increased work of breathing.  Lungs clear to auscultation bilaterally.  No wheezes. Abdomen: soft, nontender, nondistended. Normal bowel sounds.  No appreciable masses  Extremities: warm, well perfused, cap refill < 2 sec.   Musculoskeletal: Normal muscle mass.  Normal strength Skin: warm, dry.  No rash or lesions. Neurologic: alert and oriented, nonverbal, no tremor  Labs:   Results for orders placed or performed in visit on 01/31/20  POCT glycosylated hemoglobin (Hb A1C)  Result Value Ref Range   Hemoglobin A1C 7.0 (A) 4.0 - 5.6 %   HbA1c POC (<> result, manual entry)     HbA1c, POC (prediabetic range)     HbA1c, POC (controlled diabetic range)    POCT Glucose (Device for Home Use)  Result Value Ref Range   Glucose Fasting, POC 199 (A) 70 - 99 mg/dL   POC Glucose       Assessment/Plan: Toia is a 19 y.o. female with controlled type 1 diabetes on MDI and Dexcom CGM. She is having pattern of hyperglycemia post prandially and needs a stronger carb ratio. Her hemoglobin A1c is 7% today.   1-4. Controlled diabetes mellitus type 1 without complications (HCC)/Adjustment reaction/Developmental delay/hypoglycemia unawareness - Increase Lantus to 11 units - Start Novolog 150/50/10 plan  - Reviewed meter and CGM download. Discussed trends and patterns.  - Rotate injection sites to prevent scar tissue.  - bolus 15 minutes prior to eating to limit blood sugar spikes.  - Reviewed carb counting and importance of accurate carb counting.  -  Discussed signs and symptoms of hypoglycemia. Always have glucose available.  - POCT glucose and hemoglobin A1c  - Reviewed growth chart.    5. Elevated alkaline phosphatase  - Continue vitmain D supplement (Super daily Vitamin D)  - Referral placed for [name] adult endocrinology to evaluate. Discussed in detail today.    Follow-up:  3 months.     >45 spent today reviewing the medical chart, counseling the patient/family, and documenting today's visit.  When a patient is on insulin, intensive monitoring of blood glucose levels is necessary to avoid hyperglycemia and hypoglycemia. Severe hyperglycemia/hypoglycemia can lead to hospital admissions and be life threatening.      Hermenia Bers,  FNP-C  Pediatric Specialist  [address] [place]  [place], [address]  Tele: [phone]

## 2020-01-31 NOTE — Patient Instructions (Addendum)
-   Increase to 11 units of lantus   - If after 1 week she is staying over 150, increase to 12 units   - Start Novolog 1:10 carb ratio   Hypoglycemia  . Shaking or trembling. . Sweating and chills. . Dizziness or lightheadedness. . Faster heart rate. Marland Kitchen Headaches. . Hunger. . Nausea. . Nervousness or irritability. . Pale skin. Marland Kitchen Restless sleep. . Weakness. Kennis Carina vision. . Confusion or trouble concentrating. . Sleepiness. . Slurred speech. . Tingling or numbness in the face or mouth.  How do I treat an episode of hypoglycemia? The American Diabetes Association recommends the "15-15 rule" for an episode of hypoglycemia: . Eat or drink 15 grams of carbs to raise your blood sugar. . After 15 minutes, check your blood sugar. . If it's still below 70 mg/dL, have another 15 grams of carbs. . Repeat until your blood sugar is at least 70 mg/dL.  Hyperglycemia  . Frequent urination . Increased thirst . Blurred vision . Fatigue . Headache Diabetic Ketoacidosis (DKA)  If hyperglycemia goes untreated, it can cause toxic acids (ketones) to build up in your blood and urine (ketoacidosis). Signs and symptoms include: . Fruity-smelling breath . Nausea and vomiting . Shortness of breath . Dry mouth . Weakness . Confusion . Coma . Abdominal pain        Sick day/Ketones Protocol  . Check blood glucose every 2 hours  . Check urine ketones every 2 hours (until ketones are clear)  . Drink plenty of fluids (water, Pedialyte) hourly . Give rapid acting insulin correction dose every 3 hours until ketones are clear  . Notify clinic of sickness/ketones  . If you develop signs of DKA, go to ER immediately.   Hemoglobin A1c levels

## 2020-01-31 NOTE — Progress Notes (Signed)
[facility]- ENDOCRINOLOGY  [address], [address] [place], [place] [address] Telephone [phone]     Fax [phone]          Rapid-Acting Insulin Instructions (Novolog/Humalog/Apidra) (Target blood sugar 150, Insulin Sensitivity Factor 50, Insulin to Carbohydrate Ratio 1 unit for 10g)   SECTION A (Meals): 1. At mealtimes, take rapid-acting insulin according to this "Two-Component Method".  a. Measure Fingerstick Blood Glucose (or use reading on continuous glucose monitor) 0-15 minutes prior to the meal. Use the "Correction Dose Table" below to determine the dose of rapid-acting insulin needed to bring your blood sugar down to a baseline of 150. You can also calculate this dose with the following equation: (Blood sugar - target blood sugar) divided by 50.  Correction Dose Table    Blood Sugar Rapid-acting Insulin units  Blood Sugar Rapid-acting Insulin units  < 100 (-) 1  351-400 5  101-150 0  401-450 6  151-200 1  451-500 7  201-250 2  501-550 8  251-300 3  551-600 9  301-350 4  Hi (>600) 10   b. Estimate the number of grams of carbohydrates you will be eating (carb count). Use the "Food Dose Table" below to determine the dose of rapid-acting insulin needed to cover the carbs in the meal. You can also calculate this dose using this formula: Total carbs divided by 10.  Food Dose Table Grams of Carbs Rapid-acting Insulin units  Grams of Carbs Rapid-acting Insulin units  0-5 0  51-60 6  6-10 1  61-70 7  11-20 2  71-80 8  21-30 3  81-90 9  31-40 4  91-100 10  41-50 5  101-110 11     >110: take 1 unit for every additional 10g carbs    c. Add up the Correction Dose plus the Food Dose = "Total Dose" of rapid-acting insulin to be taken. d. If you know the number of carbs you will eat, take the rapid-acting insulin 0-15 minutes prior to the meal; otherwise take the insulin immediately after the meal.   SECTION B (Bedtime/2AM): 1. Wait at least 2.5-3 hours after  taking your supper rapid-acting insulin before you do your bedtime blood sugar test. Based on your blood sugar, take a "bedtime snack" according to the table below. These carbs are "Free". You don't have to cover those carbs with rapid-acting insulin.  If you want a snack with more carbs than the "bedtime snack" table allows, subtract the free carbs from the total amount of carbs in the snack and cover this carb amount with rapid-acting insulin based on the Food Dose Table from Page 1.  Use the following column for your bedtime snack: ___________________  Bedtime Carbohydrate Snack Table  Blood Sugar Large Medium Small Very Small  < 76         60 gms         50 gms         40 gms    30 gms       76-100         50 gms         40 gms         30 gms    20 gms     101-150         40 gms         30 gms         20 gms    10 gms       151-199         30 gms         20gms                       10 gms      0    200-250         20 gms         10 gms           0      0    251-300         10 gms           0           0      0      > 300           0           0                    0      0   2. If the blood sugar at bedtime is above 200, no snack is needed (though if you do want a snack, cover the entire amount of carbs based on the Food Dose Table on page 1). You will need to take additional rapid-acting insulin based on the Bedtime Sliding Scale Dose Table below.  Bedtime Sliding Scale Dose Table  Blood Sugar Rapid-acting Insulin units  <200 0  201-250 1  251-300 2  301-350 3  351-400 4  401-450 5  451-500 6  > 500 7   3. Then take your usual dose of long-acting insulin (Lantus, Basaglar, Tresiba).  4. If we ask you to check your blood sugar in the middle of the night (2AM-3AM), you should wait at least 3 hours after your last rapid-acting insulin dose before you check the blood sugar.  You will then use the Bedtime Sliding Scale Dose Table to give additional units of rapid-acting insulin if blood  sugar is above 200. This may be especially necessary in times of sickness, when the illness may cause more resistance to insulin and higher blood sugar than usual.  Michael Brennan, MD, CDE Signature: _____________________________________ Jennifer Badik, MD   Ashley Jessup, MD    Oris Staffieri, NP  Date: ______________  

## 2020-02-15 ENCOUNTER — Other Ambulatory Visit (INDEPENDENT_AMBULATORY_CARE_PROVIDER_SITE_OTHER): Payer: Self-pay | Admitting: Family

## 2020-03-06 ENCOUNTER — Other Ambulatory Visit (INDEPENDENT_AMBULATORY_CARE_PROVIDER_SITE_OTHER): Payer: Self-pay | Admitting: Family

## 2020-04-05 ENCOUNTER — Other Ambulatory Visit (INDEPENDENT_AMBULATORY_CARE_PROVIDER_SITE_OTHER): Payer: Self-pay | Admitting: Family

## 2020-05-04 ENCOUNTER — Ambulatory Visit (INDEPENDENT_AMBULATORY_CARE_PROVIDER_SITE_OTHER): Payer: Medicaid Other | Admitting: Family

## 2020-06-01 ENCOUNTER — Ambulatory Visit (INDEPENDENT_AMBULATORY_CARE_PROVIDER_SITE_OTHER): Payer: Medicaid Other | Admitting: Family

## 2020-06-01 ENCOUNTER — Encounter (INDEPENDENT_AMBULATORY_CARE_PROVIDER_SITE_OTHER): Payer: Self-pay | Admitting: Family

## 2020-06-01 ENCOUNTER — Other Ambulatory Visit: Payer: Self-pay

## 2020-06-01 VITALS — BP 116/72 | HR 84 | Wt 136.6 lb

## 2020-06-01 DIAGNOSIS — E10649 Type 1 diabetes mellitus with hypoglycemia without coma: Secondary | ICD-10-CM

## 2020-06-01 DIAGNOSIS — E109 Type 1 diabetes mellitus without complications: Secondary | ICD-10-CM

## 2020-06-01 DIAGNOSIS — E1065 Type 1 diabetes mellitus with hyperglycemia: Secondary | ICD-10-CM | POA: Diagnosis not present

## 2020-06-01 DIAGNOSIS — R739 Hyperglycemia, unspecified: Secondary | ICD-10-CM

## 2020-06-01 DIAGNOSIS — E559 Vitamin D deficiency, unspecified: Secondary | ICD-10-CM | POA: Diagnosis not present

## 2020-06-01 DIAGNOSIS — R6250 Unspecified lack of expected normal physiological development in childhood: Secondary | ICD-10-CM

## 2020-06-01 DIAGNOSIS — R748 Abnormal levels of other serum enzymes: Secondary | ICD-10-CM

## 2020-06-01 LAB — POCT GLUCOSE (DEVICE FOR HOME USE): Glucose Fasting, POC: 155 mg/dL — AB (ref 70–99)

## 2020-06-01 LAB — POCT GLYCOSYLATED HEMOGLOBIN (HGB A1C): Hemoglobin A1C: 6.4 % — AB (ref 4.0–5.6)

## 2020-06-01 NOTE — Progress Notes (Signed)
Pediatric Endocrinology Diabetes Consultation Follow-up Visit  Melanie Morrow 2001/02/24 [id number]  Chief Complaint: Follow-up type 1 diabetes   Melanie Brooklyn, FNP   HPI: Melanie Morrow  is a 20 y.o. female presenting for follow-up of type 1 diabetes. she is accompanied to this visit by her mother.  60. Melanie Morrow is a 9 y.o. with history of pervasive developmental disorder (also non-verbal) who was diagnosed with T1DM on 08/22/16 after presenting to her PCP with polyuria, polydipsia, weight loss and hyperglycemia.  At PCP's office she had BG of 238 with UA showing 2+ glucose and 2+ ketones.  She was admitted to [facility] pediatric floor where she was not in DKA.  She was started on an MDI regimen.  TFTs on admission were normal with negative ICA Ab and negative insulin Ab; GAD Ab were positive.  C-peptide was low at 1.9.  A1c was 10%.  2. Since last visit on 01/2019, she has been well.    She had four teeth extracted in Feb but did well.   She has an appointment with [name] for evaluation for elevated Alk phos levels. She is taking 2 drops of Vitamin D3 per day which is 4000 units total per day.   Mom feels like her increase in Novolog at last dose has been very helpful overall. Her blood sugars are much more stable and she is having less hyperglycemia. Hypoglycemia is very rare, they usually treat if she goes below 80. Her highest blood sugar is usually in the morning if she eats pancakes. Dexcom CGM therapy is working well.   She continues OCP for heavy menstrual cycles.    Insulin regimen: Lantus 12 units  . Novolog 150/50/10 half unit plan  Hypoglycemia: Few lows since last visit, no glucagon needed. CGM Download: Using dexcom G6 - Avg Bg 154  - Target range: in target 69%, above target 31% and below target 0%  Med-alert ID: Wearing a new bracelet today Injection sites: arms, legs and abdomen. Rotating.  Uses arms for CGM  Annual labs due: 2022 Ophthalmology due: 2023   ROS:  All  systems reviewed with pertinent positives listed below; otherwise negative. Constitutional:Sleeping well. Weight stable.   Eyes; no vision changes. No blurry vision.  HENT: No difficulty swallowing. No neck pain  Respiratory: No increased work of breathing currently GI: No constipation or diarrhea GU: Occasional spotting. Has IUD.  Musculoskeletal: No joint deformity Neuro: Normal affect. No tremors or headaches.  Endocrine: As above    Past Medical History:   Past Medical History:  Diagnosis Date  . Cognitive impairment   . Development delay    Has been evaluated by genetics and has normal chromosomes  . Dyspraxia   . Non-verbal learning disorder   . Type 1 diabetes mellitus ([place]) 08/22/2016   +GAD Ab, neg insulin Ab and ICA Ab    Medications:  Outpatient Encounter Medications as of 06/01/2020  Medication Sig Note  . LANTUS SOLOSTAR 100 UNIT/ML Solostar Pen USE UP TO 50 UNITS DAILY (Patient taking differently: Inject 10 Units into the skin at bedtime.)   . levonorgestrel (MIRENA) 20 MCG/24HR IUD 1 each by Intrauterine route once.   . Norethin Ace-Eth Estrad-FE 1-20 MG-MCG(24) CHEW Chew by mouth.   Marland Kitchen NOVOLOG FLEXPEN 100 UNIT/ML FlexPen INJECT UP TO 50 UNITS OF INSULIN PER DAY. (Patient taking differently: Inject 2-13 Units into the skin See admin instructions. Three times a day on sliding scale) 09/16/2019: Pt got 5units   . acetaminophen (TYLENOL CHILDRENS) 160  MG/5ML suspension Take 20.3 mLs (650 mg total) by mouth every 6 (six) hours as needed for moderate pain or fever. (Patient not taking: No sig reported)   . Alcohol Swabs (ALCOHOL PADS) 70 % PADS 6 wipes per day (Patient not taking: Reported on 06/01/2020)   . BD PEN NEEDLE NANO 2ND GEN 32G X 4 MM MISC USE TO INJECT INSULIN 6X DAILY (Patient not taking: Reported on 06/01/2020)   . Continuous Blood Gluc Sensor (DEXCOM G6 SENSOR) MISC CHANGE SENSOR EVERY 10 DAYS (Patient not taking: Reported on 06/01/2020)   . Continuous Blood Gluc  Transmit (DEXCOM G6 TRANSMITTER) MISC Use with Dexcom sensors (Patient not taking: Reported on 06/01/2020)   . diazePAM 5 MG/5ML SOLN Take by mouth. (Patient not taking: Reported on 06/01/2020)   . Glucagon, rDNA, (GLUCAGON EMERGENCY) 1 MG KIT FOLLOW PACKAGE DIRECTIONS FOR LOW BLOOD SUGAR. (Patient not taking: Reported on 06/01/2020)   . ibuprofen (ADVIL) 100 MG/5ML suspension Take 20 mLs (400 mg total) by mouth every 6 (six) hours as needed for fever. (Patient not taking: No sig reported)    No facility-administered encounter medications on file as of 06/01/2020.    Allergies: Allergies  Allergen Reactions  . Penicillins Hives  . Amoxicillin Hives and Rash    Surgical History: Past Surgical History:  Procedure Laterality Date  . DILATION AND CURETTAGE, DIAGNOSTIC / THERAPEUTIC    . INTRAUTERINE DEVICE (IUD) INSERTION      Family History:  Family History  Problem Relation Age of Onset  . Diabetes Mother   . Hyperlipidemia Mother   . Diabetes Maternal Uncle   . Diabetes Maternal Grandfather   . Dementia Maternal Grandfather   . Hyperlipidemia Maternal Grandfather   . Hypertension Maternal Grandfather   . Hypertension Paternal Grandmother    No family history of T1DM or hypothyroidism   Social History: Lives with: parents, grandparents also involved in her care Going into 12th grade, doing online classes currently   Physical Exam:  Vitals:   06/01/20 0913  BP: 116/72  Pulse: 84  Weight: 136 lb 9.6 oz (62 kg)   BP 116/72   Pulse 84   Wt 136 lb 9.6 oz (62 kg)   BMI 26.09 kg/m  Body mass index: body mass index is 26.09 kg/m. Growth percentile SmartLinks can only be used for patients less than 26 years old.  Ht Readings from Last 3 Encounters:  10/26/19 5' 0.67" (1.541 m) (8 %, Z= -1.42)*  10/07/19 5' 0.5" (1.537 m) (7 %, Z= -1.49)*  07/22/19 5' 0.63" (1.54 m) (8 %, Z= -1.43)*   * Growth percentiles are based on CDC (Girls, 2-20 Years) data.   Wt Readings from  Last 3 Encounters:  06/01/20 136 lb 9.6 oz (62 kg)  01/31/20 135 lb (61.2 kg)  10/26/19 137 lb (62.1 kg) (65 %, Z= 0.39)*   * Growth percentiles are based on CDC (Girls, 2-20 Years) data.   General: Well developed, well nourishedfemale with autism in no acute distress.  Head: Normocephalic, atraumatic.   Eyes:  Pupils equal and round. EOMI.   Sclera white.  No eye drainage.   Ears/Nose/Mouth/Throat: Nares patent, no nasal drainage.  Normal dentition, mucous membranes moist.   Neck: supple, no cervical lymphadenopathy, no thyromegaly Cardiovascular: regular rate, normal S1/S2, no murmurs Respiratory: No increased work of breathing.  Lungs clear to auscultation bilaterally.  No wheezes. Abdomen: soft, nontender, nondistended. Normal bowel sounds.  No appreciable masses  Extremities: warm, well perfused, cap refill <  2 sec.   Musculoskeletal: Normal muscle mass.  Normal strength Skin: warm, dry.  No rash or lesions. Neurologic: alert and oriented, normal speech, no tremor   Labs:   Results for orders placed or performed in visit on 06/01/20  POCT glycosylated hemoglobin (Hb A1C)  Result Value Ref Range   Hemoglobin A1C 6.4 (A) 4.0 - 5.6 %   HbA1c POC (<> result, manual entry)     HbA1c, POC (prediabetic range)     HbA1c, POC (controlled diabetic range)    POCT Glucose (Device for Home Use)  Result Value Ref Range   Glucose Fasting, POC 155 (A) 70 - 99 mg/dL   POC Glucose       Assessment/Plan: Julitza is a 20 y.o. female with controlled type 1 diabetes on MDI and Dexcom CGM. Time in range has greatly improved since giving stronger carb ratio. Hemoglobin A1c is 6.4% today without significant hypoglycemia which meets the ADA goal of <7%. She will be evaluated by [facility] endocrinology for elevated alk phos levels as mentioned below.    1-4. Controlled diabetes mellitus type 1 without complications (HCC)/Adjustment reaction/Developmental delay/hypoglycemia unawareness - Lantus 12  units  -  Novolog 150/50/10 plan  - Reviewed CGM download. Discussed trends and patterns.  - Rotate injection sites to prevent scar tissue.  - bolus 15 minutes prior to eating to limit blood sugar spikes.  - Reviewed carb counting and importance of accurate carb counting.  - Discussed signs and symptoms of hypoglycemia. Always have glucose available.  - POCT glucose and hemoglobin A1c  - Reviewed growth chart.    5. Elevated alkaline phosphatase  - Will see [name] endocrine for evaluation on 03/22 - Continue vitmain D drops 4000 units daily  - Essentially, Kaislee is a 20yo nonverbal, mobile female with T1DM who has had persistently elevated alkaline phosphatase (700-800 range) over the past several years, initially with vitamin D deficiency that has now been corrected. She presented to [facility] ED with abdominal pain and had an abdominal CT which showed "Diffusely sclerotic appearance of the skeleton, possibly renal osteodystrophy or hyperparathyroidism." Alkaline phosphatase remains elevated to the mid 700s (alkaline phosphatase isoenzymes 81% bone, 19% liver), normal PTH/Calcium/phosphorus/25-OH D/1,25 vitamin D/BUN/Cr/GFR>60.   Mom denies recent fractures or concerns of bone pain.  Discussed her case anonymously with my adult endocrinology colleague (Dr. Cruzita Lederer), who recommended referral to Dr. Edmonia James with [facility].   Follow-up:  3 months.   >45 spent today reviewing the medical chart, counseling the patient/family, and documenting today's visit.   When a patient is on insulin, intensive monitoring of blood glucose levels is necessary to avoid hyperglycemia and hypoglycemia. Severe hyperglycemia/hypoglycemia can lead to hospital admissions and be life threatening.      Hermenia Bers,  FNP-C  Pediatric Specialist  [address] [place]  [place], [address]  Tele: [phone]

## 2020-06-01 NOTE — Patient Instructions (Signed)

## 2020-06-07 ENCOUNTER — Encounter (INDEPENDENT_AMBULATORY_CARE_PROVIDER_SITE_OTHER): Payer: Self-pay

## 2020-06-14 ENCOUNTER — Other Ambulatory Visit (INDEPENDENT_AMBULATORY_CARE_PROVIDER_SITE_OTHER): Payer: Self-pay | Admitting: Family

## 2020-06-23 ENCOUNTER — Telehealth: Payer: Self-pay

## 2020-06-23 NOTE — Telephone Encounter (Signed)
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 Wants to know if pt needs an appt for this or if she can just drop off and pick up when ready?  Please call mom Delice Bison) at [phone]

## 2020-06-23 NOTE — Telephone Encounter (Signed)
Lmtcn

## 2020-06-23 NOTE — Telephone Encounter (Signed)
Mom returned missed call. Will drop form off on Monday and is aware that Melanie Morrow wont be in office until Wednesday.

## 2020-06-23 NOTE — Telephone Encounter (Signed)
Patient was last seen 10/07/19 and per chart note states to follow up in 1 year.  Visit was new patient appointment. Does patient need appointment ?

## 2020-06-23 NOTE — Telephone Encounter (Signed)
Lmtcb.

## 2020-06-23 NOTE — Telephone Encounter (Signed)
She can drop the form off. If I have any questions, I will call her.

## 2020-07-18 ENCOUNTER — Other Ambulatory Visit (INDEPENDENT_AMBULATORY_CARE_PROVIDER_SITE_OTHER): Payer: Self-pay | Admitting: Family

## 2020-07-18 ENCOUNTER — Encounter: Payer: Self-pay | Admitting: Family Medicine

## 2020-07-18 ENCOUNTER — Encounter (INDEPENDENT_AMBULATORY_CARE_PROVIDER_SITE_OTHER): Payer: Self-pay

## 2020-07-18 DIAGNOSIS — E109 Type 1 diabetes mellitus without complications: Secondary | ICD-10-CM

## 2020-07-18 DIAGNOSIS — R748 Abnormal levels of other serum enzymes: Secondary | ICD-10-CM

## 2020-07-19 LAB — CBC WITH DIFFERENTIAL/PLATELET
Absolute Monocytes: 401 cells/uL (ref 200–950)
Basophils Absolute: 18 cells/uL (ref 0–200)
Basophils Relative: 0.3 %
Eosinophils Absolute: 59 cells/uL (ref 15–500)
Eosinophils Relative: 1 %
HCT: 37.7 % (ref 35.0–45.0)
Hemoglobin: 12.4 g/dL (ref 11.7–15.5)
Lymphs Abs: 2148 cells/uL (ref 850–3900)
MCH: 28.5 pg (ref 27.0–33.0)
MCHC: 32.9 g/dL (ref 32.0–36.0)
MCV: 86.7 fL (ref 80.0–100.0)
MPV: 9.6 fL (ref 7.5–12.5)
Monocytes Relative: 6.8 %
Neutro Abs: 3275 cells/uL (ref 1500–7800)
Neutrophils Relative %: 55.5 %
Platelets: 335 10*3/uL (ref 140–400)
RBC: 4.35 10*6/uL (ref 3.80–5.10)
RDW: 12.7 % (ref 11.0–15.0)
Total Lymphocyte: 36.4 %
WBC: 5.9 10*3/uL (ref 3.8–10.8)

## 2020-07-20 NOTE — Progress Notes (Signed)
Lab results were electronically faxed to Valarie Cones, Ihor Austin, MD  [address]  [place], [place] [address]  [phone] (Work)  [phone] (Fax)

## 2020-07-30 ENCOUNTER — Other Ambulatory Visit (INDEPENDENT_AMBULATORY_CARE_PROVIDER_SITE_OTHER): Payer: Self-pay | Admitting: Family

## 2020-09-05 ENCOUNTER — Ambulatory Visit (INDEPENDENT_AMBULATORY_CARE_PROVIDER_SITE_OTHER): Payer: Medicaid Other | Admitting: Family

## 2020-10-11 ENCOUNTER — Encounter (INDEPENDENT_AMBULATORY_CARE_PROVIDER_SITE_OTHER): Payer: Self-pay | Admitting: Family

## 2020-10-11 ENCOUNTER — Other Ambulatory Visit: Payer: Self-pay

## 2020-10-11 ENCOUNTER — Ambulatory Visit (INDEPENDENT_AMBULATORY_CARE_PROVIDER_SITE_OTHER): Payer: Medicaid Other | Admitting: Family

## 2020-10-11 VITALS — BP 126/80 | HR 74 | Wt 142.4 lb

## 2020-10-11 DIAGNOSIS — E559 Vitamin D deficiency, unspecified: Secondary | ICD-10-CM | POA: Diagnosis not present

## 2020-10-11 DIAGNOSIS — R748 Abnormal levels of other serum enzymes: Secondary | ICD-10-CM | POA: Diagnosis not present

## 2020-10-11 DIAGNOSIS — Z794 Long term (current) use of insulin: Secondary | ICD-10-CM

## 2020-10-11 DIAGNOSIS — E109 Type 1 diabetes mellitus without complications: Secondary | ICD-10-CM

## 2020-10-11 LAB — POCT GLUCOSE (DEVICE FOR HOME USE): POC Glucose: 200 mg/dl — AB (ref 70–99)

## 2020-10-11 LAB — POCT GLYCOSYLATED HEMOGLOBIN (HGB A1C): Hemoglobin A1C: 6.7 % — AB (ref 4.0–5.6)

## 2020-10-11 NOTE — Patient Instructions (Addendum)
-  Iliac crest osteosclerosis  - elevated alk phos test  - Increase Lantus to 14 units. If blood sugars are under 120 overnight, please decrease to 13 units.  - add 1 unit to breakfast.

## 2020-10-11 NOTE — Progress Notes (Signed)
Pediatric Endocrinology Diabetes Consultation Follow-up Visit  Melanie Morrow 03-May-2000 [id number]  Chief Complaint: Follow-up type 1 diabetes   [name], FNP   HPI: Melanie Morrow  is a 20 y.o. female presenting for follow-up of type 1 diabetes. she is accompanied to this visit by her mother.  5. Melanie Morrow is a 76 y.o. with history of pervasive developmental disorder (also non-verbal) who was diagnosed with T1DM on 08/22/16 after presenting to her PCP with polyuria, polydipsia, weight loss and hyperglycemia.  At PCP's office she had BG of 238 with UA showing 2+ glucose and 2+ ketones.  She was admitted to [facility] pediatric floor where she was not in DKA.  She was started on an MDI regimen.  TFTs on admission were normal with negative ICA Ab and negative insulin Ab; GAD Ab were positive.  C-peptide was low at 1.9.  A1c was 10%.  2. Since last visit on 05/2020 she has been well.    Has been busy vacationing for summer break.   Melanie Morrow is followed by [facility] for elevated Alk phos levels. She is taking Vitamin D 2 drops per 4000 units per day. [name] is requesting genetic testing for findings.   Using Dexcom CGM which is working well. Mom felt like after adjustments at last visit her blood sugar had improved. Over the last month she has been running higher, especially overnight.  Novolog: She typically gets 6-8 units at meals. Hypoglycemia is very rare.    Insulin regimen: Lantus 12 units  . Novolog 150/50/10 half unit plan  Hypoglycemia: Few lows since last visit, no glucagon needed. CGM Download: Using dexcom G6   Med-alert ID: Wearing a new bracelet today Injection sites: arms, legs and abdomen. Rotating.  Uses arms for CGM  Annual labs due: 2022 Ophthalmology due: 2023   ROS:  All systems reviewed with pertinent positives listed below; otherwise negative. Constitutional:Sleeping well. Weight stable.   Eyes; no vision changes. No blurry vision.  HENT: No difficulty swallowing. No  neck pain  Respiratory: No increased work of breathing currently GI: No constipation or diarrhea GU: Occasional spotting. Has IUD.  Musculoskeletal: No joint deformity Neuro: Normal affect. No tremors or headaches.  Endocrine: As above    Past Medical History:   Past Medical History:  Diagnosis Date   Cognitive impairment    Development delay    Has been evaluated by genetics and has normal chromosomes   Dyspraxia    Non-verbal learning disorder    Type 1 diabetes mellitus ([place]) 08/22/2016   +GAD Ab, neg insulin Ab and ICA Ab    Medications:  Outpatient Encounter Medications as of 10/11/2020  Medication Sig   Alcohol Swabs (ALCOHOL PADS) 70 % PADS 6 wipes per day   BD PEN NEEDLE NANO 2ND GEN 32G X 4 MM MISC USE TO INJECT INSULIN 6X DAILY   Continuous Blood Gluc Sensor (DEXCOM G6 SENSOR) MISC CHANGE SENSOR EVERY 10 DAYS   Continuous Blood Gluc Transmit (DEXCOM G6 TRANSMITTER) MISC Use with Dexcom sensors   diazePAM 5 MG/5ML SOLN Take by mouth.   Glucagon, rDNA, (GLUCAGON EMERGENCY) 1 MG KIT FOLLOW PACKAGE DIRECTIONS FOR LOW BLOOD SUGAR.   insulin glargine (LANTUS SOLOSTAR) 100 UNIT/ML Solostar Pen INJECT UP TO 50 UNITS SUBCUTANEOUSLY DAILY   levonorgestrel (MIRENA) 20 MCG/24HR IUD 1 each by Intrauterine route once.   Norethin Ace-Eth Estrad-FE 1-20 MG-MCG(24) CHEW Chew by mouth.   NOVOLOG FLEXPEN 100 UNIT/ML FlexPen INJECT UP TO 50 UNITS OF INSULIN PER DAY.  acetaminophen (TYLENOL CHILDRENS) 160 MG/5ML suspension Take 20.3 mLs (650 mg total) by mouth every 6 (six) hours as needed for moderate pain or fever. (Patient not taking: No sig reported)   ibuprofen (ADVIL) 100 MG/5ML suspension Take 20 mLs (400 mg total) by mouth every 6 (six) hours as needed for fever. (Patient not taking: No sig reported)   levonorgestrel (MIRENA) 20 MCG/DAY IUD by Intrauterine route. (Patient not taking: Reported on 10/11/2020)   No facility-administered encounter medications on file as of 10/11/2020.     Allergies: Allergies  Allergen Reactions   Penicillins Hives   Amoxicillin Hives and Rash    Surgical History: Past Surgical History:  Procedure Laterality Date   DILATION AND CURETTAGE, DIAGNOSTIC / THERAPEUTIC     INTRAUTERINE DEVICE (IUD) INSERTION     MULTIPLE TOOTH EXTRACTIONS Bilateral     Family History:  Family History  Problem Relation Age of Onset   Diabetes Mother    Hyperlipidemia Mother    Diabetes Maternal Uncle    Diabetes Maternal Grandfather    Dementia Maternal Grandfather    Hyperlipidemia Maternal Grandfather    Hypertension Maternal Grandfather    Hypertension Paternal Grandmother    No family history of T1DM or hypothyroidism   Social History: Lives with: parents, grandparents also involved in her care   Physical Exam:  Vitals:   10/11/20 1006  BP: 126/80  Pulse: 74  Weight: 142 lb 6.4 oz (64.6 kg)    BP 126/80 (BP Location: Right Arm, Patient Position: Sitting, Cuff Size: Normal)   Pulse 74   Wt 142 lb 6.4 oz (64.6 kg)   BMI 27.20 kg/m  Body mass index: body mass index is 27.2 kg/m. Growth percentile SmartLinks can only be used for patients less than 43 years old.  Ht Readings from Last 3 Encounters:  10/26/19 5' 0.67" (1.541 m) (8 %, Z= -1.42)*  10/07/19 5' 0.5" (1.537 m) (7 %, Z= -1.49)*  07/22/19 5' 0.63" (1.54 m) (8 %, Z= -1.43)*   * Growth percentiles are based on CDC (Girls, 2-20 Years) data.   Wt Readings from Last 3 Encounters:  10/11/20 142 lb 6.4 oz (64.6 kg)  06/01/20 136 lb 9.6 oz (62 kg)  01/31/20 135 lb (61.2 kg)   General: Well developed, well nourished female with  developmental delay in no acute distress.   Head: Normocephalic, atraumatic.   Eyes:  Pupils equal and round. EOMI.   Sclera white.  No eye drainage.   Ears/Nose/Mouth/Throat: Nares patent, no nasal drainage.  Normal dentition, mucous membranes moist.   Neck: supple, no cervical lymphadenopathy, no thyromegaly Cardiovascular: regular rate,  normal S1/S2, no murmurs Respiratory: No increased work of breathing.  Lungs clear to auscultation bilaterally.  No wheezes. Abdomen: soft, nontender, nondistended. Normal bowel sounds.  No appreciable masses  Extremities: warm, well perfused, cap refill < 2 sec.   Musculoskeletal: Normal muscle mass.  Normal strength Skin: warm, dry.  No rash or lesions. Neurologic: alert and oriented, nonverbal, no tremor   Labs:   Results for orders placed or performed in visit on 10/11/20  POCT glycosylated hemoglobin (Hb A1C)  Result Value Ref Range   Hemoglobin A1C 6.7 (A) 4.0 - 5.6 %   HbA1c POC (<> result, manual entry)     HbA1c, POC (prediabetic range)     HbA1c, POC (controlled diabetic range)    POCT Glucose (Device for Home Use)  Result Value Ref Range   Glucose Fasting, POC     POC  Glucose 200 (A) 70 - 99 mg/dl     Assessment/Plan: Princessa is a 20 y.o. female with controlled type 1 diabetes on MDI and Dexcom CGM. She is having more hyperglycemia overnight and after breakfast. Hemoglobin A1c is 6.7% today which meets ADA goal of <7%. Being monitored by [name] adult endocrinology for elevated alk phos levels.   1-4. Controlled diabetes mellitus type 1 without complications (HCC)/Adjustment reaction/Developmental delay/hypoglycemia unawareness - Increase lantus to 14 units. If blood sugars overnight/early morning are under 120, decrease to 13 units  -  Novolog 150/50/10 plan add 1 unit to breakfast - Reviewed CGM download. Discussed trends and patterns.  - Rotate injection sites to prevent scar tissue.  - bolus 15 minutes prior to eating to limit blood sugar spikes.  - Reviewed carb counting and importance of accurate carb counting.  - Discussed signs and symptoms of hypoglycemia. Always have glucose available.  - POCT glucose and hemoglobin A1c  - Reviewed growth chart.   5. Elevated alkaline phosphatase  - [facility] and management.  - Continue  vitmain D drops 4000 units daily  - Essentially, Willisha is a 20yo nonverbal, mobile female with T1DM who has had persistently elevated alkaline phosphatase (700-800 range) over the past several years, initially with vitamin D deficiency that has now been corrected. She presented to [facility] ED with abdominal pain and had an abdominal CT which showed "Diffusely sclerotic appearance of the skeleton, possibly renal osteodystrophy or hyperparathyroidism." Alkaline phosphatase remains elevated to the mid 700s (alkaline phosphatase isoenzymes 81% bone, 19% liver), normal PTH/Calcium/phosphorus/25-OH D/1,25 vitamin D/BUN/Cr/GFR>60.   Mom denies recent fractures or concerns of bone pain.  Discussed her case anonymously with my adult endocrinology colleague (Dr. Cruzita Lederer), who recommended referral to Dr. Edmonia James with [facility].   Follow-up:  3 months.    >45 spent today reviewing the medical chart, counseling the patient/family, and documenting today's visit.  When a patient is on insulin, intensive monitoring of blood glucose levels is necessary to avoid hyperglycemia and hypoglycemia. Severe hyperglycemia/hypoglycemia can lead to hospital admissions and be life threatening.      Hermenia Bers,  FNP-C  Pediatric Specialist  [address] [place]  [place], [address]  Tele: [phone]

## 2020-10-22 ENCOUNTER — Encounter (INDEPENDENT_AMBULATORY_CARE_PROVIDER_SITE_OTHER): Payer: Self-pay

## 2020-10-25 ENCOUNTER — Other Ambulatory Visit (INDEPENDENT_AMBULATORY_CARE_PROVIDER_SITE_OTHER): Payer: Self-pay | Admitting: Family

## 2020-10-25 ENCOUNTER — Encounter (INDEPENDENT_AMBULATORY_CARE_PROVIDER_SITE_OTHER): Payer: Self-pay

## 2020-10-25 NOTE — Progress Notes (Unsigned)
ste

## 2020-10-27 ENCOUNTER — Other Ambulatory Visit (INDEPENDENT_AMBULATORY_CARE_PROVIDER_SITE_OTHER): Payer: Self-pay | Admitting: Family

## 2020-10-27 DIAGNOSIS — R748 Abnormal levels of other serum enzymes: Secondary | ICD-10-CM

## 2020-10-29 NOTE — Progress Notes (Signed)
MEDICAL GENETICS NEW PATIENT EVALUATION  Patient name: Melanie Morrow DOB: [birth year] Age: 20 y.o. MRN: [id number]  Referring Provider/Specialty: Hermenia Bers, NP / Pediatric Endocrinology Date of Evaluation: 11/02/2020 Chief Complaint/Reason for Referral: Alkaline phosphatase elevation  HPI: Melanie Morrow is a 20 y.o. female who presents today for an initial genetics evaluation for elevated alkaline phosphatase. She is accompanied by her mother (legal guardian) at today's visit.   Melanie Morrow experienced global delays from early on. She crawled and walked later than normal. She is nonverbal and communicates through grunting, pointing, some sign language, and a communication device that she uses at school. She has been diagnosed with dyspraxia, and has intellectual disability, but mother does not recall ever having IQ testing done. She has had a normal brain MRI in the past. Melanie Morrow was reportedly evaluated for autism in the past but found to not have autism. She is very social and understands things that are said to her well. She has some fine motor difficulties and needs some help with things, like fastening clasps and buttons or taking a bath. She has some difficulty with stairs and balance, but is not prone to falling. Melanie Morrow received speech therapy in the past until the start of the [place] pandemic. She attends [employer], where she receives [employer] [facility] services and is in a self-contained classroom. Parents have guardianship over her.   Melanie Morrow was diagnosed with type 1 diabetes in June 2018. She has since followed with endocrinologist NP Hermenia Bers. She has a continuous glucose monitor and receives insulin injections. In August 2019, Melanie Morrow was noted to have vitamin D deficiency and elevated alkaline phosphatase on her annual labs. She began a vitamin D supplement, which corrected the deficiency, but she continued to have elevated ALP (700-800 range). In June 2021 Melanie Morrow  was sen in the [facility] ED for abdominal pain. 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 Gale Journey at [facility]. DXA bone density scan showed high bone density. Dr. Gale Journey then recommended Curt Bears undergo genetic testing of genes associated with osteopetrosis, such as through the panel offered by [facility] lab. She was therefore referred to genetics.  Prior genetic testing has been performed. Melanie Morrow was seen by Dr. Abelina Bachelor in 2004 for developmental delay. At that time, karyotype and fragile X testing were performed, both of which were normal. No additional testing has been performed in the interim.  Pregnancy/Birth History: Melanie Morrow was born to a then 21 year old G48P0 -> P1 mother. The pregnancy was conceived naturally and was complicated by anemia requiring iron supplements. There were no exposures and labs were normal. Ultrasounds were normal. Amniotic fluid levels were normal. Fetal activity was normal. No genetic testing was performed during the pregnancy.  [facility] Apgar scores were 8/9. She required suctioning for meconium stained fluid. Birth weight 7 lbs 5 oz/3.317 kg (50-75%), birth length 21.5 in/54.6 cm (>90%), head circumference 36.8 cm (>90%). She did not require a NICU stay. She was discharged home a couple days after birth. She passed the newborn screen, hearing test and congenital heart screen.  Past Medical History: Past Medical History:  Diagnosis Date   Cognitive impairment    Development delay    Has been evaluated by genetics and has normal chromosomes   Dyspraxia    Non-verbal learning disorder    Type  1 diabetes mellitus ([place]) 08/22/2016   +GAD Ab, neg insulin Ab and ICA Ab   Patient Active Problem List   Diagnosis Date Noted   Alkaline  phosphatase elevation 05/05/2018   Insulin dose changed ([place]) 07/16/2017   Type 1 diabetes mellitus without complication ([place]) 69/48/5462   Parent coping with child illness or disability 01/15/2017   Adjustment reaction to medical therapy 01/15/2017   Developmental delay 01/15/2017   Nonverbal 01/15/2017   DM (diabetes mellitus), type 1 ([place]) 08/22/2016    Past Surgical History:  Past Surgical History:  Procedure Laterality Date   DILATION AND CURETTAGE, DIAGNOSTIC / THERAPEUTIC     INTRAUTERINE DEVICE (IUD) INSERTION     MULTIPLE TOOTH EXTRACTIONS Bilateral     Developmental History: Milestones -- Global delays. Nonverbal.  Therapies -- was in speech therapy until start of COVID pandemic.  Toilet training -- can go to bathroom by herself but needs help wiping.  School -- [employer]. EC services. Self contained classroom.  Social History: Social History   Social History Narrative   Lives with parents. Stays with both MGM and PGM/PGF during summer days and after school.    Medications: Current Outpatient Medications on File Prior to Visit  Medication Sig Dispense Refill   Alcohol Swabs (ALCOHOL PADS) 70 % PADS 6 wipes per day 200 each 6   BD PEN NEEDLE NANO 2ND GEN 32G X 4 MM MISC USE TO INJECT INSULIN 6X DAILY 200 each 5   Continuous Blood Gluc Sensor (DEXCOM G6 SENSOR) MISC CHANGE SENSOR EVERY 10 DAYS 3 each 5   Continuous Blood Gluc Transmit (DEXCOM G6 TRANSMITTER) MISC Use with Dexcom sensors 1 each 1   Glucagon, rDNA, (GLUCAGON EMERGENCY) 1 MG KIT FOLLOW PACKAGE DIRECTIONS FOR LOW BLOOD SUGAR. 2 kit 1   insulin glargine (LANTUS SOLOSTAR) 100 UNIT/ML Solostar Pen INJECT UP TO 50 UNITS SUBCUTANEOUSLY DAILY 15 mL 5   levonorgestrel (MIRENA) 20 MCG/24HR IUD 1 each by Intrauterine route once.     Norethin Ace-Eth Estrad-FE 1-20 MG-MCG(24) CHEW Chew by mouth.     NOVOLOG FLEXPEN 100 UNIT/ML FlexPen INJECT UP TO 50 UNITS OF INSULIN PER DAY. 15 mL 5    acetaminophen (TYLENOL CHILDRENS) 160 MG/5ML suspension Take 20.3 mLs (650 mg total) by mouth every 6 (six) hours as needed for moderate pain or fever. (Patient not taking: No sig reported) 355 mL 0   diazePAM 5 MG/5ML SOLN Take by mouth. (Patient not taking: Reported on 11/02/2020)     ibuprofen (ADVIL) 100 MG/5ML suspension Take 20 mLs (400 mg total) by mouth every 6 (six) hours as needed for fever. (Patient not taking: No sig reported) 473 mL 0   levonorgestrel (MIRENA) 20 MCG/DAY IUD by Intrauterine route. (Patient not taking: Reported on 10/11/2020)     No current facility-administered medications on file prior to visit.    Allergies:  Allergies  Allergen Reactions   Penicillins Hives   Amoxicillin Hives and Rash    Immunizations: up to date  Review of Systems: General: sleeps well. Eating well- good appetite. Difficulty communicating when something hurts or not feeling well. Nonverbal. Eyes/vision: left eye drifts. Haven't seen eye doctor in a while. Unsure about depth perception. Ears/hearing: no concerns. Dental: sees dentist. Three wisdom teeth and one tooth that was sideways removed earlier this year. One tooth on bottom front missing. Other teeth crooked/turned. Difficult to brush teeth. Respiratory: no concerns. Cardiovascular: no concerns. Gastrointestinal: no concerns. Genitourinary: no concerns. Endocrine: T1DM. Periods  started 12 or 13- heavy flow. IUD placed 2017 and helped prevent periods for 3 years, then came back. On low dose chewable birth control now as well. Elevated ALP. Hematologic: no concerns. Immunologic: no concerns.  Neurological: normal brain MRI. Intellectual disability. Psychiatric: sensory issues. Musculoskeletal: high bone density, sclerotic appearance of bones. 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 Left foot turns out. No history of fractures. Skin, Hair, Nails: skin picking.  Family  History: See pedigree below obtained during today's visit:    Notable family history: Melanie Morrow is the only child to her parents. Her mother is 76 yo, 5'2", and has type 2 diabetes and high cholesterol. The maternal uncle and grandfather also have type 2 diabetes. The grandfather died at 36 and had vascular dementia. The maternal great grandfather had pancreatic cancer and great grandmother had ovarian cancer. There is a distant maternal relative who was slow to walk and talk.   Melanie Morrow's father is 71 yo, 5'8", and healthy. The paternal grandmother has osteoporosis and has had multiple back surgeries. The paternal grandfather has hearing loss and has had a hip replacement.  Mother's ethnicity: White Father's ethnicity: White Consanguinity: Denies  Physical Examination: Weight: 64.5 kg (70%) Height (from 10/2019): 60.67 in (7.7%); mid-parental 25% Head circumference: 59.5 cm (>98%)  Pulse 92   Wt 142 lb 3.2 oz (64.5 kg)   HC 59.5 cm (23.43")   BMI 27.16 kg/m   General: Alert, interactive and attentive to the conversation, shakes head "yes" or "no" to respond or points to mother, appropriately, though lacks purposeful speech Head: Normocephalic (not overtly macrocephalic), coarse brow line, prominent chin Eyes: Wideset with intermittent strabismus Nose: Tubular appearance Lips/Mouth/Teeth: Small mouth, gap (missing tooth) in central mandibular region, significant dental crowding and disorganization of teeth, high palate  Ears: Normoset and normally formed, no pits, tags or creases Neck: Elongated but otherwise normal appearance Heart: Warm and well perfused Lungs: No increased work of breathing Skin: Normal appearance Hair: Normal anterior and posterior hairline (has anterior hair whorls), normal texture Neurologic: Normal gross motor by observation, no abnormal movements Psych: Non-verbal but comprehended our conversations and answered questions appropriately by pointing to mom or  shaking head yes/no; maturity with interactions is younger than age; enjoyed playing with doll Back/spine: ?scoliosis -- left shoulder lower than right, more sloped Extremities: Symmetric and proportionate; no limited range of motion in wrist or arms; legs rotated out in tibial region Hands/Feet: Tapered fingers, Thick PIP joints, Long 2nd toes, 2 palmar creases bilaterally, No clinodactyly, syndactyly or polydactyly  Photo of patient in media tab (parental verbal consent obtained)  Prior Genetic testing: Karyotype & Fragile X testing (2004, [facility]): normal  Pertinent Labs:  05/2020: Alkaline Phosphatase, Bone Sp mcg/L 274   -------------------REFERENCE VALUE--------------------------  <=14 (Premenopausal)  <=22 (Postmenopausal)     Component     Latest Ref Rng & Units 01/27/2018 10/15/2018 04/19/2019  Glucose     70 - 99 mg/dL 106 (H) 160 (H)   BUN     6 - 20 mg/dL 14 12   Creatinine     0.44 - 1.00 mg/dL 0.58 0.59   BUN/Creatinine Ratio     6 - 22 (calc) NOT APPLICABLE NOT APPLICABLE   Sodium     135 - 145 mmol/L 141 137   Potassium     3.5 - 5.1 mmol/L 4.1 4.3   Chloride     98 - 111 mmol/L 105  103   CO2     22 - 32 mmol/L 25 24   Calcium     8.9 - 10.3 mg/dL 9.7 9.8   Total Protein     6.5 - 8.1 g/dL 6.7 6.9   Albumin MSPROF     3.6 - 5.1 g/dL 4.4 4.5   Globulin     2.0 - 3.8 g/dL (calc) 2.3 2.4   AG Ratio     1.0 - 2.5 (calc) 1.9 1.9   Total Bilirubin     0.3 - 1.2 mg/dL 0.5 0.6   Alkaline phosphatase (APISO)     36 - 128 U/L 728 (H) 765 (H) 768 (H)  AST     15 - 41 U/L 12 12   ALT     0 - 44 U/L 11 9   Albumin     3.5 - 5.0 g/dL     Alkaline Phosphatase     38 - 126 U/L     GFR, Est Non African American     >60 mL/min     GFR, Est African American     >60 mL/min     Anion gap     5 - 15     Intestinal Isoenzymes     1 - 24 %     Bone Isoenzymes     28 - 66 %     Liver Isoenzymes     25 - 69 %     Placental isoenzymes     <=0 %      Macrohepatic isoenzymes     <=0 %     Phosphorus     2.5 - 4.5 mg/dL 4.4    PTH, Intact     12 - 71 pg/mL 29     Component     Latest Ref Rng & Units 09/15/2019 09/23/2019  Glucose     70 - 99 mg/dL 212 (H)   BUN     6 - 20 mg/dL 11   Creatinine     0.44 - 1.00 mg/dL 0.58   BUN/Creatinine Ratio     6 - 22 (calc)    Sodium     135 - 145 mmol/L 133 (L)   Potassium     3.5 - 5.1 mmol/L 3.8   Chloride     98 - 111 mmol/L 97 (L)   CO2     22 - 32 mmol/L 23   Calcium     8.9 - 10.3 mg/dL 8.9   Total Protein     6.5 - 8.1 g/dL 7.6   Albumin MSPROF     3.6 - 5.1 g/dL    Globulin     2.0 - 3.8 g/dL (calc)    AG Ratio     1.0 - 2.5 (calc)    Total Bilirubin     0.3 - 1.2 mg/dL 1.8 (H)   Alkaline phosphatase (APISO)     36 - 128 U/L  722 (H)  AST     15 - 41 U/L 20   ALT     0 - 44 U/L 21   Albumin     3.5 - 5.0 g/dL 4.3   Alkaline Phosphatase     38 - 126 U/L 725 (H)   GFR, Est Non African American     >60 mL/min >60   GFR, Est African American     >60 mL/min >60   Anion gap     5 - 15 13  Intestinal Isoenzymes     1 - 24 %  0 (L)  Bone Isoenzymes     28 - 66 %  81 (H)  Liver Isoenzymes     25 - 69 %  19 (L)  Placental isoenzymes     <=0 %  0  Macrohepatic isoenzymes     <=0 %  0  Phosphorus     2.5 - 4.5 mg/dL  4.4  PTH, Intact     12 - 71 pg/mL      Component     Latest Ref Rng & Units 07/19/2020  WBC     3.8 - 10.8 Thousand/uL 5.9  RBC     3.80 - 5.10 Million/uL 4.35  Hemoglobin     11.7 - 15.5 g/dL 12.4  HCT     35.0 - 45.0 % 37.7  MCV     80.0 - 100.0 fL 86.7  MCH     27.0 - 33.0 pg 28.5  MCHC     32.0 - 36.0 g/dL 32.9  RDW     11.0 - 15.0 % 12.7  Platelets     140 - 400 Thousand/uL 335  MPV     7.5 - 12.5 fL 9.6  NEUT#     1,500 - 7,800 cells/uL 3,275  Lymphocyte #     850 - 3,900 cells/uL 2,148  Absolute Monocytes     200 - 950 cells/uL 401  Eosinophils Absolute     15 - 500 cells/uL 59  Basophils Absolute     0 - 200  cells/uL 18  Neutrophils     % 55.5  Total Lymphocyte     % 36.4  Monocytes Relative     % 6.8  Eosinophil     % 1.0  Basophil     % 0.3    Pertinent Imaging/Studies: DXA 05/2020 reviewed (high bone density per Endo)  Xray bone survey 06/06/2020: FINDINGS:  There is diffuse sclerosis of the visualized osseous structures of the axial and appendicular skeleton with associated cortical thickening. No focal osseous lesion is identified. The bones appear significantly expanded. No acute fracture is identified.  The visualized joint spaces are maintained.  There is mild broad dextrocurvature of the thoracic and lumbar spine.  Visualized lungs are clear.  Soft tissues are unremarkable.    IMPRESSION:  Diffusely sclerotic appearance of the skeleton of indeterminate etiology.  There is a broad differential including but not limited to metabolic etiologies, renal osteodystrophy, osteopetrosis.    Assessment: Melanie Morrow is a 20 y.o. female with intellectual disability, expressive language delay/dyspraxia (she is non-verbal) and fine motor delay. Her receptive language is intact. She is unable to live independently and requires assistance with tasks such as dressing, bathing, toileting. 4 years ago, she was diagnosed with Type 1 diabetes. Within the last 3-4 years, blood work has shown elevated alkaline phosphatase that was thought to possibly be due to vitamin D deficiency. This was corrected with Vitamin D supplementation however the alkaline phosphatase elevation persisted. Renal and parathyroid etiologies have been ruled out. More recent subsequent imaging has shown a sclerotic appearance of her bones. She was seen by an adult endocrinologist at [facility] who is concerned for osteopetrosis as a possibility. 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 Physical examination notable for a distinct facial appearance and subtle differences in her digits. Family history is unremarkable for  similar features.  Melanie Morrow's previous testing was reviewed with the mother. She is aware that we each  have over 20,000 genes, each with an important role in the body. All of the genes are packaged into structures called chromosomes. We have two copies of every chromosome- one that is inherited from the mother and one that is inherited from the father- and thus two copies of every gene. Given Melanie Morrow's features, concern for a genetic defect as the cause of her symptoms has arisen. If a specific genetic abnormality can be identified, it may help provide further insight into prognosis, management, and recurrence risk.  Previous testing has included karyotype and fragile X testing. Karyotype assesses chromosomes to determine if all copies are present and if there are any obvious rearrangements. Fragile X testing assesses the number of CGG repeats within the FMR1 gene- individuals with less than 45 repeats are considered to have normal testing, and those with more than 200 repeats have Fragile X syndrome. Both tests were normal.  Consideration may now be given to testing of all the genes for any pathogenic variants that may explain Melanie Morrow's features. This would include etiologies for her intellectual disability, dyspraxia, type 1 diabetes, and sclerotic bones. There may be a unifying diagnosis or there may be separate genetic etiologies for each piece. This is known as whole exome sequencing. This testing will look at genes that are associated with osteopetrosis, including those on the [facility] panel requested by Dr. Gale Journey. Mother is interested in pursuing this testing. The family does NOT want to know of secondary findings. The consent form, possible results (positive, negative, and variant of uncertain significance), and expected timeline were reviewed with the mother. A sample was collected today to be sent to [facility]. The mother's sample was also collected in clinic and a test kit sent home for the father for use  in interpretation.  Recommendations: Whole exome sequencing (trio) Consider Ophthalmology referral (given strabismus and also with osteopetrosis, visual impairment can occur due to bone sclerosis/compression on the optic nerve)  A buccal sample was obtained on Melanie Morrow and her mother during today's visit for the above genetic testing and sent to [facility]. A buccal swab kit was provided for the father for sample collection at home. Results are anticipated in 2-3 months after the lab receives all 3 samples. We will contact the family to discuss results once available and arrange follow-up as needed.   For [facility]: Please comment on the following osteopetrosis genes in analysis: AMER1, CA2, CLCN7, CTSK, FAM20C, FERMT3, LEMD3, LRP5, OSTM1, PLEKHM1, SNX10, TCIRG1, TNFRSF11A, TNFSF11   Heidi Dach, MS, [facility] Certified Genetic Counselor  Artist Pais, D.O. Attending Physician, [facility] Date: 11/07/2020 Time: 5:12pm   Total time spent: 80 minutes Time spent includes face to face and non-face to face care for the patient on the date of this encounter (history and physical, genetic counseling, coordination of care, data gathering and/or documentation as outlined)

## 2020-10-30 ENCOUNTER — Encounter (INDEPENDENT_AMBULATORY_CARE_PROVIDER_SITE_OTHER): Payer: Self-pay

## 2020-10-30 ENCOUNTER — Other Ambulatory Visit (INDEPENDENT_AMBULATORY_CARE_PROVIDER_SITE_OTHER): Payer: Self-pay | Admitting: Pediatrics

## 2020-11-02 ENCOUNTER — Encounter (INDEPENDENT_AMBULATORY_CARE_PROVIDER_SITE_OTHER): Payer: Self-pay | Admitting: Pediatric Genetics

## 2020-11-02 ENCOUNTER — Ambulatory Visit (INDEPENDENT_AMBULATORY_CARE_PROVIDER_SITE_OTHER): Payer: Medicaid Other | Admitting: Pediatric Genetics

## 2020-11-02 ENCOUNTER — Other Ambulatory Visit: Payer: Self-pay

## 2020-11-02 VITALS — HR 92 | Wt 142.2 lb

## 2020-11-02 DIAGNOSIS — E109 Type 1 diabetes mellitus without complications: Secondary | ICD-10-CM | POA: Diagnosis not present

## 2020-11-02 DIAGNOSIS — R748 Abnormal levels of other serum enzymes: Secondary | ICD-10-CM | POA: Diagnosis not present

## 2020-11-02 DIAGNOSIS — R4701 Aphasia: Secondary | ICD-10-CM

## 2020-11-02 DIAGNOSIS — Q799 Congenital malformation of musculoskeletal system, unspecified: Secondary | ICD-10-CM

## 2020-11-02 DIAGNOSIS — R6250 Unspecified lack of expected normal physiological development in childhood: Secondary | ICD-10-CM

## 2020-11-02 NOTE — Patient Instructions (Signed)
[facility] we are committed to providing exceptional care. You will receive a patient satisfaction survey through text or email regarding your visit today. Your opinion is important to me. Comments are appreciated.  

## 2020-11-06 ENCOUNTER — Telehealth (INDEPENDENT_AMBULATORY_CARE_PROVIDER_SITE_OTHER): Payer: Self-pay | Admitting: Pediatric Genetics

## 2020-11-06 ENCOUNTER — Encounter (INDEPENDENT_AMBULATORY_CARE_PROVIDER_SITE_OTHER): Payer: Self-pay

## 2020-11-06 NOTE — Telephone Encounter (Signed)
  Who's calling (name and relationship to patient) : Tani, Virgo (Mother) Best contact number: [phone] (Mobile) Provider they see: Loletha Grayer, DO Reason for call: Mom does not want to do the secondary gene testing.     PRESCRIPTION REFILL ONLY  Name of prescription:  Pharmacy:

## 2020-11-07 ENCOUNTER — Encounter (INDEPENDENT_AMBULATORY_CARE_PROVIDER_SITE_OTHER): Payer: Self-pay

## 2020-11-07 ENCOUNTER — Encounter (INDEPENDENT_AMBULATORY_CARE_PROVIDER_SITE_OTHER): Payer: Self-pay | Admitting: Pediatric Genetics

## 2020-11-09 ENCOUNTER — Encounter (INDEPENDENT_AMBULATORY_CARE_PROVIDER_SITE_OTHER): Payer: Self-pay

## 2020-11-10 ENCOUNTER — Other Ambulatory Visit (INDEPENDENT_AMBULATORY_CARE_PROVIDER_SITE_OTHER): Payer: Self-pay

## 2020-11-10 ENCOUNTER — Encounter (INDEPENDENT_AMBULATORY_CARE_PROVIDER_SITE_OTHER): Payer: Self-pay

## 2020-11-10 MED ORDER — DEXCOM G6 SENSOR MISC: 5 refills | Status: DC

## 2020-11-10 NOTE — Progress Notes (Signed)
[facility] [address], [place], [place] [address] Phone: [phone] Fax: [phone]                                          Diabetes Medical Management Plan                                               School Year 2022 - 2023 *This diabetes plan serves as a healthcare provider order, transcribe onto school form.   The nurse will teach school staff procedures as needed for diabetic care in the school.Melanie Morrow   DOB: 04/13/2000   School: ____[employer]___________________________  Parent/Guardian: ___Lawrence,Tara________________________phone #: __[phone]___________________  Parent/Guardian: ___________________________phone #: _____________________  Diabetes Diagnosis: Type 1 Diabetes  ______________________________________________________________________  Blood Glucose Monitoring   Target range for blood glucose is: 80-180 mg/dL  Times to check blood glucose level: Before meals, As needed for signs/symptoms, and Before dismissal of school  Student has a CGM (Continuous Glucose Monitor): Yes-Dexcom Student may use blood sugar reading from continuous glucose monitor to determine insulin dose.   CGM Alarms. If CGM alarm goes off and student is unsure of how to respond to alarm, student should be escorted to school nurse/school diabetes team member. If CGM is not working or if student is not wearing it, check blood sugar via fingerstick. If CGM is dislodged, do NOT throw it away, and return it to parent/guardian. CGM site may be reinforced with medical tape. If glucose is low on CGM 15 minutes after hypoglycemia treatment, check glucose with fingerstick and glucometer.  It appears most diabetes technology has not been studied with use of Evolv Express body scanners. These Evolv Express body scanners seem to be most similar to body scanners at the airport.  Most diabetes  technology recommends against wearing a continuous glucose monitor or insulin pump in a body scanner or x-ray machine, therefore, [facility] pediatric specialist endocrinology providers do not recommend wearing a continuous glucose monitor or insulin pump through an Evolv Express body scanner. Hand-wanding, pat-downs, visual inspection, and walk-through metal detectors are OK to use.   Student's Self Care for Glucose Monitoring: dependent (needs supervision AND assistance) Self treats mild hypoglycemia: No  It is preferable to treat hypoglycemia in the classroom so student does not miss instructional time.  If the student is not in the classroom (ie at recess or specials, etc) and does not have fast sugar with them, then they should be escorted to the school nurse/school diabetes team member. If the student has a CGM and uses a cell phone as the reader device, the cell phone should be with them at all times.    Hypoglycemia (Low Blood Sugar) Hyperglycemia (High Blood Sugar)   Shaky                           Dizzy Sweaty                         Weakness/Fatigue Pale                              Headache  Fast Heart Beat            Blurry vision Hungry                         Slurred Speech Irritable/Anxious           Seizure  Complaining of feeling low or CGM alarms low  Frequent urination          Abdominal Pain Increased Thirst              Headaches           Nausea/Vomiting            Fruity Breath Sleepy/Confused            Chest Pain Inability to Concentrate Irritable Blurred Vision   Check glucose if signs/symptoms above Stay with child at all times Give 15 grams of carbohydrate (fast sugar) if blood sugar is less than 80 mg/dL, and child is conscious, cooperative, and able to swallow.  3-4 glucose tabs Half cup (4 oz) of juice or regular soda Check blood sugar in 15 minutes. If blood sugar does not improve, give fast sugar again If still no improvement after 2 fast sugars, call  provider and parent/guardian. Call 911, parent/guardian and/or child's health care provider if Child's symptoms do not go away Child loses consciousness Unable to reach parent/guardian and symptoms worsen  If child is UNCONSCIOUS, experiencing a seizure or unable to swallow Place student on side  Administer dosage formulation of glucagon (Baqsimi/Gvoke/Glucagon For Injection) depending on the dosage formulation prescribed to the patient.   Glucagon Formulation Dose  Baqsimi Regardless of weight: 3 mg  Gvoke Hypopen <45 kg: 0.5 mg/0.mL  > 45 kg: 1 mg/0.2 mL  Glucagon for injection <20 kg: 0.5 mg/0.5 mL >20 kg: 1 mg/1 mL   CALL 911, parent/guardian, and/or child's health care provider  *Pump- Review pump therapy guidelines Check glucose if signs/symptoms above Check Ketones if above 300 mg/dL after 2 glucose checks if ketone strips are available. Notify Parent/Guardian if glucose is over 300 mg/dL and patient has ketones in urine. Encourage water/sugar free to drink, allow unlimited use of bathroom Administer insulin as below if it has been over 3 hours since last insulin dose Recheck glucose in 2.5-3 hours CALL 911 if child Loses consciousness Unable to reach parent/guardian and symptoms worsen       8.   If moderate to large ketones or no ketone strips available to check urine ketones, contact parent.  *Pump Check pump function Check pump site Check tubing Treat for hyperglycemia as above Refer to Pump Therapy Orders              Do not allow student to walk anywhere alone when blood sugar is low or suspected to be low.  Follow this protocol even if immediately prior to a meal.    Insulin Therapy    Adjustable Insulin, 2 Component Method:  See actual method below.  Two Component Method (Multiple Daily Injections) [facility]  [address] [place], [place] [address] Telephone [phone]     Fax [phone]          Rapid-Acting Insulin Instructions (Novolog/Humalog/Apidra) (Target blood sugar 150, Insulin Sensitivity Factor 50, Insulin to Carbohydrate Ratio 1 unit for 10g)   SECTION A (Meals): 1. At mealtimes, take rapid-acting insulin according to this "Two-Component Method".  a. Measure Fingerstick Blood Glucose (or use reading on  continuous glucose monitor) 0-15 minutes prior to the meal. Use the "Correction Dose Table" below to determine the dose of rapid-acting insulin needed to bring your blood sugar down to a baseline of 150. You can also calculate this dose with the following equation: (Blood sugar - target blood sugar) divided by 50.  Correction Dose Table    Blood Sugar Rapid-acting Insulin units  Blood Sugar Rapid-acting Insulin units  < 100 (-) 1  351-400 5  101-150 0  401-450 6  151-200 1  451-500 7  201-250 2  501-550 8  251-300 3  551-600 9  301-350 4  Hi (>600) 10   b. Estimate the number of grams of carbohydrates you will be eating (carb count). Use the "Food Dose Table" below to determine the dose of rapid-acting insulin needed to cover the carbs in the meal. You can also calculate this dose using this formula: Total carbs divided by 10.  Food Dose Table Grams of Carbs Rapid-acting Insulin units  Grams of Carbs Rapid-acting Insulin units  0-5 0  51-60 6  6-10 1  61-70 7  11-20 2  71-80 8  21-30 3  81-90 9  31-40 4  91-100 10  41-50 5  101-110 11     >110: take 1 unit for every additional 10g carbs    c. Add up the Correction Dose plus the Food Dose = "Total Dose" of rapid-acting insulin to be taken. d. If you know the number of carbs you will eat, take the rapid-acting insulin 0-15 minutes prior to the meal; otherwise take the insulin immediately after the meal.     When to give insulin Breakfast: Carbohydrate coverage plus correction dose per attached plan when glucose is above 150mg /dl and 3 hours since last insulin dose Lunch: Carbohydrate coverage  plus correction dose per attached plan when glucose is above 150mg /dl and 3 hours since last insulin dose Snack: Carbohydrate coverage only per attached plan  Student's Self Care Insulin Administration Skills: dependent (needs supervision AND assistance)  If there is a change in the daily schedule (field trip, delayed opening, early release or class party), please contact parents for instructions.  Parents/Guardians Authorization to Adjust Insulin Dose: Yes:  Parents/guardians are authorized to increase or decrease insulin doses plus or minus 3 units.   Pump Therapy       Physical Activity, Exercise and Sports  A quick acting source of carbohydrate such as glucose tabs or juice must be available at the site of physical education activities or sports. Melanie ReiningKathryn Deleon is encouraged to participate in all exercise, sports and activities.  Do not withhold exercise for high blood glucose.   Melanie ReiningKathryn Sizemore may participate in sports, exercise if blood glucose is above 100.  For blood glucose below 100 before exercise, give 15 grams carbohydrate snack without insulin.   Testing  ALL STUDENTS SHOULD HAVE A 504 PLAN or IHP (See 504/IHP for additional instructions).  The student may need to step out of the testing environment to take care of personal health needs (example:  treating low blood sugar or taking insulin to correct high blood sugar).   The student should be allowed to return to complete the remaining test pages, without a time penalty.   The student must have access to glucose tablets/fast acting carbohydrates/juice at all times. The student will need to be within 20 feet of their CGM reader/phone, and insulin pump reader/phone.   SPECIAL INSTRUCTIONS:   I give permission to the school  nurse, trained diabetes personnel, and other designated staff members of _________________________school to perform and carry out the diabetes care tasks as outlined by [facility]  Medical Management Plan.  I also consent to the release of the information contained in this Diabetes Medical Management Plan to all staff members and other adults who have custodial care of Mishawn Didion and who may need to know this information to maintain [employer] health and safety.       Physician Signature:Oriyah Lamphear Dalbert Garnet,  [facility]  Pediatric Specialist  [address]  [place], [address]  Tele: [phone]             Date: 11/10/2020 Parent/Guardian Signature: _______________________  Date: ___________________

## 2020-11-16 ENCOUNTER — Telehealth (INDEPENDENT_AMBULATORY_CARE_PROVIDER_SITE_OTHER): Payer: Self-pay | Admitting: Family

## 2020-11-16 NOTE — Telephone Encounter (Signed)
Who's calling (name and relationship to patient) : Melanie Morrow mom   Best contact number: [phone]  Provider they see: Gretchen Short  Reason for call: Needs prior auth for [facility]. Mom has tried to send [employer] and has had no response. Please call to inform mom of what needs to happen next.   Call ID:      PRESCRIPTION REFILL ONLY  Name of prescription:  Pharmacy:

## 2020-11-22 ENCOUNTER — Telehealth (INDEPENDENT_AMBULATORY_CARE_PROVIDER_SITE_OTHER): Payer: Self-pay | Admitting: Family

## 2020-11-22 MED ORDER — DEXCOM G6 TRANSMITTER MISC: 1 refills | Status: DC

## 2020-11-22 NOTE — Telephone Encounter (Signed)
Called [facility] [employer]. They still do not have the PA paperwork. I verified the fax. [phone]. I refaxed the paperwork. I spoke with mom let her know. I also let her know that we have 1 more sample in the office. I put it up at the front desk for her to come get. I will keep her update with the PA.

## 2020-11-22 NOTE — Telephone Encounter (Signed)
  Who's calling (name and relationship to patient) :Melanie Morrow (Mother)  Best contact number:  Provider they see: Luella Cook  Reason for [phone] Mom states that she needs someone to call [facility] so that a new prior authorization is submitted for patients dexcom sensors. Mom states that are leaving to go out of town at the end of week. Mother also asking if there are sensors at the office that she could possibly pick up. Mother would like a call back.     PRESCRIPTION REFILL ONLY  Name of prescription:  Pharmacy:

## 2020-12-13 ENCOUNTER — Other Ambulatory Visit (INDEPENDENT_AMBULATORY_CARE_PROVIDER_SITE_OTHER): Payer: Self-pay | Admitting: Family

## 2020-12-19 ENCOUNTER — Encounter: Payer: Self-pay | Admitting: Nurse Practitioner

## 2020-12-19 ENCOUNTER — Telehealth (INDEPENDENT_AMBULATORY_CARE_PROVIDER_SITE_OTHER): Payer: Self-pay | Admitting: Family

## 2020-12-19 ENCOUNTER — Ambulatory Visit (INDEPENDENT_AMBULATORY_CARE_PROVIDER_SITE_OTHER): Payer: Medicaid Other | Admitting: Nurse Practitioner

## 2020-12-19 VITALS — Temp 102.4°F

## 2020-12-19 DIAGNOSIS — U071 COVID-19: Secondary | ICD-10-CM | POA: Diagnosis not present

## 2020-12-19 MED ORDER — MOLNUPIRAVIR EUA 200MG CAPSULE: 4.0000 | ORAL_CAPSULE | Freq: Two times a day (BID) | ORAL | 0 refills | Status: AC

## 2020-12-19 NOTE — Telephone Encounter (Signed)
Returned call to mom, explained that per the sick day protocol her sugars may start running higher, give her plenty of sugar free fluids  . Let mom know if she is unable to keep any fluids down to check for ketones. If she has large or moderate ketones to bring her to the [facility] ED Mom asked if she should continue to give her insulin.  I stated yes, she should.  Explained her sugars may run higher.  However if she doesn't eat and her blood sugars are lower to please give fluid with sugar such as juice, gatorade etc.  Mom verbalized understanding.

## 2020-12-19 NOTE — Telephone Encounter (Signed)
  Who's calling (name and relationship to patient) : Mom  Mechele Collin contact number: [phone]  Provider they UXL:[id number]   Reason for call: Patient tested positive for covid and is concerned about her diabetes. Want to know  what she should look out for or be concerned about anything because she is type 1     PRESCRIPTION REFILL ONLY  Name of prescription:  Pharmacy:

## 2020-12-19 NOTE — Assessment & Plan Note (Signed)
Take meds as prescribed - Use a cool mist humidifier  -Use saline nose sprays frequently -Force fluids -For fever or aches or pains- take Tylenol or ibuprofen. -Molnupirivir RX sent to pharmacy, education provided.  -Positive for COVID-19. Follow up with worsening unresolved symptoms

## 2020-12-19 NOTE — Progress Notes (Signed)
   Virtual Visit  Note Due to COVID-19 pandemic this visit was conducted virtually. This visit type was conducted due to national recommendations for restrictions regarding the COVID-19 Pandemic (e.g. social distancing, sheltering in place) in an effort to limit this patient's exposure and mitigate transmission in our community. All issues noted in this document were discussed and addressed.  A physical exam was not performed with this format.  I connected with Melanie Morrow on 12/19/20 at 9:35 AM by telephone and verified that I am speaking with the correct person using two identifiers. Melanie Morrow is currently located at home and her mother is with her and speaking for her during visit. The provider, Daryll Drown, NP is located in their office at time of visit.  I discussed the limitations, risks, security and privacy concerns of performing an evaluation and management service by telephone and the availability of in person appointments. I also discussed with the patient that there may be a patient responsible charge related to this service. The patient expressed understanding and agreed to proceed.   History and Present Illness:  URI  This is a new problem. Episode onset: 3-4 days. The problem has been gradually worsening. The maximum temperature recorded prior to her arrival was 101 - 101.9 F. The fever has been present for 1 to 2 days. Associated symptoms include a sore throat. Pertinent negatives include no rash. She has tried acetaminophen for the symptoms. The treatment provided moderate relief.     Review of Systems  Constitutional:  Positive for chills and fever.  HENT:  Positive for sore throat.   Respiratory: Negative.    Cardiovascular: Negative.   Skin:  Negative for rash.  All other systems reviewed and are negative.   Observations/Objective: Televisit patient not in distress according to mother  Assessment and Plan: Take meds as prescribed - Use a cool mist  humidifier  -Use saline nose sprays frequently -Force fluids -For fever or aches or pains- take Tylenol or ibuprofen. -Molnupirivir RX sent to pharmacy, education provided.  -Positive for COVID-19. Follow up with worsening unresolved symptoms   Follow Up Instructions: Follow-up with unresolved symptoms    I discussed the assessment and treatment plan with the patient. The patient was provided an opportunity to ask questions and all were answered. The patient agreed with the plan and demonstrated an understanding of the instructions.   The patient was advised to call back or seek an in-person evaluation if the symptoms worsen or if the condition fails to improve as anticipated.  The above assessment and management plan was discussed with the patient. The patient verbalized understanding of and has agreed to the management plan. Patient is aware to call the clinic if symptoms persist or worsen. Patient is aware when to return to the clinic for a follow-up visit. Patient educated on when it is appropriate to go to the emergency department.   Time call ended: 9:45 AM  I provided 10 minutes of  non face-to-face time during this encounter.    Daryll Drown, NP

## 2021-01-03 ENCOUNTER — Telehealth (INDEPENDENT_AMBULATORY_CARE_PROVIDER_SITE_OTHER): Payer: Self-pay | Admitting: Genetic Counselor

## 2021-01-03 NOTE — Telephone Encounter (Signed)
Mom returned phone call to follow up.  looking forward to a return call.

## 2021-01-03 NOTE — Telephone Encounter (Signed)
Called to discuss result of genetic testing. Left voicemail requesting parent or guardian call me back. ? ?Melanie Morrow, CGC ? ?

## 2021-01-11 ENCOUNTER — Encounter (INDEPENDENT_AMBULATORY_CARE_PROVIDER_SITE_OTHER): Payer: Self-pay | Admitting: Family

## 2021-01-11 ENCOUNTER — Other Ambulatory Visit: Payer: Self-pay

## 2021-01-11 ENCOUNTER — Ambulatory Visit (INDEPENDENT_AMBULATORY_CARE_PROVIDER_SITE_OTHER): Payer: Medicaid Other | Admitting: Family

## 2021-01-11 VITALS — BP 116/72 | HR 76 | Wt 141.2 lb

## 2021-01-11 DIAGNOSIS — E559 Vitamin D deficiency, unspecified: Secondary | ICD-10-CM | POA: Diagnosis not present

## 2021-01-11 DIAGNOSIS — R6250 Unspecified lack of expected normal physiological development in childhood: Secondary | ICD-10-CM

## 2021-01-11 DIAGNOSIS — Z23 Encounter for immunization: Secondary | ICD-10-CM | POA: Diagnosis not present

## 2021-01-11 DIAGNOSIS — R748 Abnormal levels of other serum enzymes: Secondary | ICD-10-CM

## 2021-01-11 DIAGNOSIS — E109 Type 1 diabetes mellitus without complications: Secondary | ICD-10-CM

## 2021-01-11 DIAGNOSIS — Z794 Long term (current) use of insulin: Secondary | ICD-10-CM

## 2021-01-11 LAB — POCT GLYCOSYLATED HEMOGLOBIN (HGB A1C): Hemoglobin A1C: 6.6 % — AB (ref 4.0–5.6)

## 2021-01-11 LAB — POCT GLUCOSE (DEVICE FOR HOME USE): Glucose Fasting, POC: 187 mg/dL — AB (ref 70–99)

## 2021-01-11 NOTE — Patient Instructions (Signed)
-   Start by increase to 15 units of lantus . After 1 weeks, if she is still running above 150 overnight and in the morning, increase 16 units.   It was a pleasure seeing you in clinic today. Please do not hesitate to contact me if you have questions or concerns.   Please sign up for MyChart. This is a communication tool that allows you to send an email directly to me. This can be used for questions, prescriptions and blood sugar reports. We will also release labs to you with instructions on MyChart. Please do not use MyChart if you need immediate or emergency assistance. Ask our wonderful front office staff if you need assistance.

## 2021-01-11 NOTE — Progress Notes (Signed)
Pediatric Endocrinology Diabetes Consultation Follow-up Visit  Melanie Morrow 2000/12/12 [id number]  Chief Complaint: Follow-up type 1 diabetes   Melanie Brooklyn, FNP   HPI: Melanie Morrow  is a 20 y.o. female presenting for follow-up of type 1 diabetes. she is accompanied to this visit by her mother.  87. Melanie Morrow is a 89 y.o. with history of pervasive developmental disorder (also non-verbal) who was diagnosed with T1DM on 08/22/16 after presenting to her PCP with polyuria, polydipsia, weight loss and hyperglycemia.  At PCP's office she had BG of 238 with UA showing 2+ glucose and 2+ ketones.  She was admitted to [facility] pediatric floor where she was not in DKA.  She was started on an MDI regimen.  TFTs on admission were normal with negative ICA Ab and negative insulin Ab; GAD Ab were positive.  C-peptide was low at 1.9.  A1c was 10%.  2. Since last visit on 09/2020 she has been well.    She has started back at school which is going well for her but has been stressful because it is the schools first time with someone having diabetes. She also had COVID in September, had fever and sore throat but otherwise did well.   Melanie Morrow was seen by Genetics and was diagnosed with Sat B2 syndrome, a rare genetic disease. They will have a follow up visit next week to get additional information. Continues to be followed by [facility] for elevated Alk phos.   She is wearing a Dexcom CGM which is working well for her. Her injections are given by parents or grandparents, usually after she eats. She eats between 60-100 grams of carbs at meals. Hypoglycemia is rare, occasionally occurs in the afternoon. She is unable to communicate when she is low.   Concerns: - Running high overnight and in the morning.    Insulin regimen: Lantus 14 units  . Novolog 150/50/10 half unit plan  Hypoglycemia: Few lows since last visit, no glucagon needed. CGM Download: Using dexcom   Med-alert ID: Wearing a new bracelet  today Injection sites: arms, legs and abdomen. Rotating.  Uses arms for CGM  Annual labs due: 2022 Ophthalmology due: 2023   ROS:  All systems reviewed with pertinent positives listed below; otherwise negative. Constitutional:Sleeping well. Weight is stable.  Eyes; no vision changes. No blurry vision.  HENT: No difficulty swallowing. No neck pain  Respiratory: No increased work of breathing currently GI: No constipation or diarrhea GU: Occasional spotting. Has IUD.  Musculoskeletal: No joint deformity Neuro: Normal affect. No tremors or headaches.  Endocrine: As above    Past Medical History:   Past Medical History:  Diagnosis Date   Cognitive impairment    Development delay    Has been evaluated by genetics and has normal chromosomes   Dyspraxia    Non-verbal learning disorder    Type 1 diabetes mellitus ([place]) 08/22/2016   +GAD Ab, neg insulin Ab and ICA Ab    Medications:  Outpatient Encounter Medications as of 01/11/2021  Medication Sig   Alcohol Swabs (ALCOHOL PADS) 70 % PADS 6 wipes per day   BD PEN NEEDLE NANO U/F 32G X 4 MM MISC USE TO INJECT INSULIN 6X DAILY   Continuous Blood Gluc Sensor (DEXCOM G6 SENSOR) MISC Change sensor every 10 days   Continuous Blood Gluc Transmit (DEXCOM G6 TRANSMITTER) MISC Use with Dexcom sensors   Glucagon, rDNA, (GLUCAGON EMERGENCY) 1 MG KIT FOLLOW PACKAGE DIRECTIONS FOR LOW BLOOD SUGAR.   insulin glargine (LANTUS SOLOSTAR) 100  UNIT/ML Solostar Pen INJECT UP TO 50 UNITS SUBCUTANEOUSLY DAILY   levonorgestrel (MIRENA) 20 MCG/24HR IUD 1 each by Intrauterine route once.   Norethin Ace-Eth Estrad-FE 1-20 MG-MCG(24) CHEW Chew by mouth.   NOVOLOG FLEXPEN 100 UNIT/ML FlexPen INJECT UP TO 50 UNITS OF INSULIN PER DAY.   acetaminophen (TYLENOL CHILDRENS) 160 MG/5ML suspension Take 20.3 mLs (650 mg total) by mouth every 6 (six) hours as needed for moderate pain or fever. (Patient not taking: No sig reported)   diazePAM 5 MG/5ML SOLN Take by mouth.  (Patient not taking: No sig reported)   ibuprofen (ADVIL) 100 MG/5ML suspension Take 20 mLs (400 mg total) by mouth every 6 (six) hours as needed for fever. (Patient not taking: No sig reported)   levonorgestrel (MIRENA) 20 MCG/DAY IUD by Intrauterine route. (Patient not taking: No sig reported)   No facility-administered encounter medications on file as of 01/11/2021.    Allergies: Allergies  Allergen Reactions   Penicillins Hives   Amoxicillin Hives and Rash    Surgical History: Past Surgical History:  Procedure Laterality Date   DILATION AND CURETTAGE, DIAGNOSTIC / THERAPEUTIC     INTRAUTERINE DEVICE (IUD) INSERTION     MULTIPLE TOOTH EXTRACTIONS Bilateral     Family History:  Family History  Problem Relation Age of Onset   Diabetes Mother    Hyperlipidemia Mother    Diabetes Maternal Uncle    Diabetes Maternal Grandfather    Dementia Maternal Grandfather    Hyperlipidemia Maternal Grandfather    Hypertension Maternal Grandfather    Hypertension Paternal Grandmother    No family history of T1DM or hypothyroidism   Social History: Lives with: parents, grandparents also involved in her care   Physical Exam:  Vitals:   01/11/21 0837  BP: 116/72  Pulse: 76  Weight: 141 lb 3.2 oz (64 kg)     BP 116/72 (BP Location: Right Arm, Patient Position: Sitting, Cuff Size: Normal)   Pulse 76   Wt 141 lb 3.2 oz (64 kg)   BMI 26.97 kg/m  Body mass index: body mass index is 26.97 kg/m. Growth percentile SmartLinks can only be used for patients less than 60 years old.  Ht Readings from Last 3 Encounters:  10/26/19 5' 0.67" (1.541 m) (8 %, Z= -1.42)*  10/07/19 5' 0.5" (1.537 m) (7 %, Z= -1.49)*  07/22/19 5' 0.63" (1.54 m) (8 %, Z= -1.43)*   * Growth percentiles are based on CDC (Girls, 2-20 Years) data.   Wt Readings from Last 3 Encounters:  01/11/21 141 lb 3.2 oz (64 kg)  11/02/20 142 lb 3.2 oz (64.5 kg)  10/11/20 142 lb 6.4 oz (64.6 kg)   General: Well  developed, well nourished female in no acute distress.  Head: Normocephalic, atraumatic.   Eyes:  Pupils equal and round. EOMI.   Sclera white.  No eye drainage.   Ears/Nose/Mouth/Throat: Nares patent, no nasal drainage.  Normal dentition, mucous membranes moist.   Neck: supple, no cervical lymphadenopathy, no thyromegaly Cardiovascular: regular rate, normal S1/S2, no murmurs Respiratory: No increased work of breathing.  Lungs clear to auscultation bilaterally.  No wheezes. Abdomen: soft, nontender, nondistended. Normal bowel sounds.  No appreciable masses  Extremities: warm, well perfused, cap refill < 2 sec.   Musculoskeletal: Normal muscle mass.  Normal strength Skin: warm, dry.  No rash or lesions. Neurologic: alert and oriented, + nonverbal , no tremor    Labs:   Results for orders placed or performed in visit on 01/11/21  POCT  glycosylated hemoglobin (Hb A1C)  Result Value Ref Range   Hemoglobin A1C 6.6 (A) 4.0 - 5.6 %   HbA1c POC (<> result, manual entry)     HbA1c, POC (prediabetic range)     HbA1c, POC (controlled diabetic range)    POCT Glucose (Device for Home Use)  Result Value Ref Range   Glucose Fasting, POC 187 (A) 70 - 99 mg/dL   POC Glucose       Assessment/Plan: Brietta is a 20 y.o. female with controlled type 1 diabetes on MDI and Dexcom CGM. Having pattern of hyperglycemia overnight, will increase long acting insulin. Hemoglobin A1c is excellent at 6.6% with <1% time in hypoglycemic range. Being monitored by [name] adult endocrinology for elevated alk phos levels.   1-4. Controlled diabetes mellitus type 1 without complications (HCC)/Adjustment reaction/Developmental delay/hypoglycemia unawareness - Increase lantus to 15 units. After 1 week, may increase to 16 units if blood sugars are running over 150 at night.  -  Novolog 150/50/10 plan add 1 unit to breakfast - Reviewed insulin pump and CGM download. Discussed trends and patterns.  - Rotate pump sites to  prevent scar tissue.  - bolus 15 minutes prior to eating to limit blood sugar spikes.  - Reviewed carb counting and importance of accurate carb counting.  - Discussed signs and symptoms of hypoglycemia. Always have glucose available.  - POCT glucose and hemoglobin A1c  - Reviewed growth chart.  - Discussed insulin pump therapy but would like to remain with injections at this time.   5. Elevated alkaline phosphatase  - [facility] follow [facility] with [facility] for evaluation and management.  - Continue vitmain D drops 4000 units daily  - Essentially, Deliah is a 20yo nonverbal, mobile female with T1DM who has had persistently elevated alkaline phosphatase (700-800 range) over the past several years, initially with vitamin D deficiency that has now been corrected. She presented to [facility] ED with abdominal pain and had an abdominal CT which showed "Diffusely sclerotic appearance of the skeleton, possibly renal osteodystrophy or hyperparathyroidism." Alkaline phosphatase remains elevated to the mid 700s (alkaline phosphatase isoenzymes 81% bone, 19% liver), normal PTH/Calcium/phosphorus/25-OH D/1,25 vitamin D/BUN/Cr/GFR>60.   Mom denies recent fractures or concerns of bone pain.  Discussed her case anonymously with my adult endocrinology colleague (Dr. Cruzita Lederer), who recommended referral to Dr. Edmonia James with [facility].   Influenza vaccine given. Counseling provided.   Follow-up:  3 months.    >45 spent today reviewing the medical chart, counseling the patient/family, and documenting today's visit.  When a patient is on insulin, intensive monitoring of blood glucose levels is necessary to avoid hyperglycemia and hypoglycemia. Severe hyperglycemia/hypoglycemia can lead to hospital admissions and be life threatening.      Hermenia Bers,  FNP-C  Pediatric Specialist  [address] [place]  [place], [address]  Tele: [phone]

## 2021-01-17 ENCOUNTER — Ambulatory Visit (INDEPENDENT_AMBULATORY_CARE_PROVIDER_SITE_OTHER): Payer: Medicaid Other | Admitting: Pediatric Genetics

## 2021-01-24 NOTE — Progress Notes (Signed)
MEDICAL GENETICS FOLLOW-UP VISIT  Patient name: Melanie Morrow DOB: 12-12-2000 Age: 20 y.o. MRN: [id number]  Initial Referring Provider/Specialty: Hermenia Bers, NP / Pediatric Endocrinology Date of Evaluation: 01/25/2021 Chief Complaint/Reason for Referral: Review results of whole exome sequencing  HPI: Melanie Morrow is a 20 y.o. female who previously underwent genetic testing. Her mother (legal guardian) presents today to review the results. Melanie Morrow is not present given the purpose of this visit.  To review, their initial visit was on 11/02/2020 at 20 years old for elevated alkaline phosphatase levels of unclear etiology. We also noted that she had intellectual disability, significant expressive language delay/dyspraxia (non-verbal) and fine motor delay. Her receptive language is intact. She is unable to live independently and requires assistance with tasks such as dressing, bathing, toileting. 4 years ago, she was diagnosed with Type 1 diabetes (GAD antibody positive). Within the last 3-4 years, blood work has shown elevated alkaline phosphatase that was thought to possibly be due to vitamin D deficiency. This was corrected with Vitamin D supplementation however the alkaline phosphatase elevation persisted. Renal and parathyroid etiologies have been ruled out. More recent subsequent imaging has shown a sclerotic appearance of her bones. She was seen by an adult endocrinologist at [facility] who is concerned for osteopetrosis as a possibility. 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 Physical examination notable for a distinct facial appearance and subtle differences in her digits. Family history is unremarkable for similar features.  We recommended whole exome sequencing which showed a de novo pathogenic variant in [place].  Since that visit, mom reports Melanie Morrow is doing well. They are planning to find a new eye doctor now that Dr. Annamaria Boots has retired; one of her eyes does seem to  be drifting). She will also be aging out of her pediatrician's office and will need a new PCP. They are interested in finding a PCP who has experience caring for adults with developmental disabilities. They will schedule a return visit to [facility] Endocr[facility] (Dr. Gale Journey) to inform them of the exome finding.  Past Medical History: Past Medical History:  Diagnosis Date   Cognitive impairment    Development delay    Has been evaluated by genetics and has normal chromosomes   Dyspraxia    Non-verbal learning disorder    Type 1 diabetes mellitus ([place]) 08/22/2016   +GAD Ab, neg insulin Ab and ICA Ab   Patient Active Problem List   Diagnosis Date Noted   Monoallelic mutation of SATB2 gene 01/26/2021   Positive self-administered antigen test for COVID-19 12/19/2020   Alkaline phosphatase elevation 05/05/2018   Insulin dose changed ([place]) 07/16/2017   Type 1 diabetes mellitus without complication ([place]) 44/31/5400   Parent coping with child illness or disability 01/15/2017   Adjustment reaction to medical therapy 01/15/2017   Developmental delay 01/15/2017   Nonverbal 01/15/2017   DM (diabetes mellitus), type 1 ([place]) 08/22/2016    Past Surgical History:  Past Surgical History:  Procedure Laterality Date   DILATION AND CURETTAGE, DIAGNOSTIC / THERAPEUTIC     INTRAUTERINE DEVICE (IUD) INSERTION     MULTIPLE TOOTH EXTRACTIONS Bilateral    Social History: Social History   Social History Narrative   Lives with parents. Stays with both MGM and PGM/PGF during summer days and after school.    Medications: Current Outpatient Medications on File Prior to Visit  Medication Sig Dispense Refill   acetaminophen (TYLENOL CHILDRENS) 160 MG/5ML suspension Take 20.3 mLs (650 mg total) by mouth every 6 (six) hours as  needed for moderate pain or fever. (Patient not taking: No sig reported) 355 mL 0   Alcohol Swabs (ALCOHOL PADS) 70 % PADS 6 wipes per day 200 each 6   BD PEN NEEDLE NANO U/F 32G X 4 MM  MISC USE TO INJECT INSULIN 6X DAILY 200 each 5   Continuous Blood Gluc Sensor (DEXCOM G6 SENSOR) MISC Change sensor every 10 days 3 each 5   Continuous Blood Gluc Transmit (DEXCOM G6 TRANSMITTER) MISC Use with Dexcom sensors 1 each 1   diazePAM 5 MG/5ML SOLN Take by mouth. (Patient not taking: No sig reported)     Glucagon, rDNA, (GLUCAGON EMERGENCY) 1 MG KIT FOLLOW PACKAGE DIRECTIONS FOR LOW BLOOD SUGAR. 2 kit 1   ibuprofen (ADVIL) 100 MG/5ML suspension Take 20 mLs (400 mg total) by mouth every 6 (six) hours as needed for fever. (Patient not taking: No sig reported) 473 mL 0   insulin glargine (LANTUS SOLOSTAR) 100 UNIT/ML Solostar Pen INJECT UP TO 50 UNITS SUBCUTANEOUSLY DAILY 15 mL 5   levonorgestrel (MIRENA) 20 MCG/24HR IUD 1 each by Intrauterine route once.     levonorgestrel (MIRENA) 20 MCG/DAY IUD by Intrauterine route. (Patient not taking: No sig reported)     Norethin Ace-Eth Estrad-FE 1-20 MG-MCG(24) CHEW Chew by mouth.     NOVOLOG FLEXPEN 100 UNIT/ML FlexPen INJECT UP TO 50 UNITS OF INSULIN PER DAY. 15 mL 5   No current facility-administered medications on file prior to visit.    Allergies:  Allergies  Allergen Reactions   Penicillins Hives   Amoxicillin Hives and Rash    Immunizations: Up to date  Review of Systems (updates in bold): General: sleeps well. Eating well- good appetite. Difficulty communicating when something hurts or not feeling well. Nonverbal. Eyes/vision: left eye drifts. Unsure about depth perception. Will schedule ophtho f/u. Ears/hearing: no concerns. Dental: sees dentist. Three wisdom teeth and one tooth that was sideways removed earlier this year. One tooth on bottom front missing. Other teeth crooked/turned. Difficult to brush teeth. Respiratory: no concerns. Cardiovascular: no concerns. Gastrointestinal: no concerns. Genitourinary: no concerns. Endocrine: T1DM. Periods started 12 or 13- heavy flow. IUD placed 2017 and helped prevent periods for 3  years, then came back. On low dose chewable birth control now as well. Elevated ALP. Hematologic: no concerns. Immunologic: no concerns.  Neurological: normal brain MRI. Intellectual disability. Psychiatric: sensory issues. Musculoskeletal: high bone density, sclerotic appearance of bones. 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 Left foot turns out. No history of fractures. Skin, Hair, Nails: skin picking.  Family History: Melanie Morrow's father had a heart attack and had a stent placed 10/2020. Melanie Morrow's paternal grandmother recently passed away.  Physical Examination: Patient not present  Updated Genetic testing: Whole exome sequencing ([facility]):   Pertinent New Labs: none  Pertinent New Imaging/Studies: none  Assessment: Melanie Morrow is a 20 y.o. female with intellectual disability, significant expressive language delay/dyspraxia (non-verbal) and fine motor delay. Her receptive language is intact. She is unable to live independently and requires assistance with tasks such as dressing, bathing, toileting. Her parents are her legal guardians. 4 years ago, she was diagnosed with Type 1 diabetes (GAD antibody positive). Within the last 3-4 years, blood work has shown elevated alkaline phosphatase that was thought to possibly be due to vitamin D deficiency. This was corrected with Vitamin D supplementation however the alkaline phosphatase elevation persisted. Renal and parathyroid etiologies have been ruled out. More recent subsequent imaging  has shown a sclerotic, dense appearance of her bones. She was seen by an adult endocrinologist at [facility] who is concerned for osteopetrosis as a possibility and she was therefore referred for genetic testing. 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 Physical examination notable for a distinct facial appearance and subtle differences in her digits. Family history is unremarkable for similar  features.  Melanie Morrow underwent whole exome sequencing with parental samples submitted for comparison. Testing identified a single pathogenic variant in SATB2 (c.1165 C>T p.(R389C)) that was not inherited from either parent, but rather occurred as a new (de novo) change in Melanie Morrow. This finding, associated with SATB2-associated syndrome, is likely the cause of Melanie Morrow's intellectual disability, dyspraxia, and elevated alkaline phosphatase.  SATB2-associated Syndrome (SAS) Individuals with pathogenic variants in SATB2 typically have developmental delay and intellectual disability ranging in severity, with some individuals having absent speech. Developmental regression is not typical. Behavioral differences can vary, including friendly personality, autistic like features, aggressive outbursts, anxiety, high pain tolerance, obsessive tendencies, and sleep abnormalities. Brain abnormalities have been noted in half. Other neurological differences may include differences in muscle tone (most often hypotonia), abnormal EEG with or without seizures, and gait abnormalities. Skeletal differences are more common, in particular pectus and spine differences and dental anomalies. Some individuals have been noted to have elevated ALP levels with osteopenia and low bone mineral density. Mild nonspecific dysmorphic features have been noted. Some individuals have SAS as a result of a deletion of SATB2 and surrounding genes. These individuals may have additional symptoms as well (genitourinary anomalies, cardiac defects, and ectodermal differences).  For Melanie Morrow, we do feel her elevated alkaline phosphatase is explained by this. She does not have osteopenia or low BMD, but rather "high" density from what we see in chart review and maternal report. We discussed that high bone density does not necessarily indicate "stronger" bones and she could still have osteopenia or risk of fractures.  Management The following evaluations, if  not previously performed, are recommended after initial diagnosis per [facility]:    The following surveillance is recommended in those with SAS per [employer]:    Inheritance SAS is an autosomal dominant condition, meaning a single pathogenic variant in one copy of the SATB2 gene is sufficient to cause symptoms. Almost all cases of SAS have occurred as a result of a de novo change in the affected individual. Therefore, the risk to siblings of the affected individual is considered low. Those who have an SATB2 variant would be expected to have symptoms. If an individual with SAS were to have children, there would be a 50% chance of passing the SATB2 variant on to each child.  Of note, this finding does not explain Jeni's type 1 diabetes. We do not fully understand the genetic contributions of type 1 diabetes well at this time to be able to perform genetic testing for this indication, particularly in individuals who are antibody-positive.  Recommendations: Return to [facility] Endocrinology and inform them of this genetic test result to see if they want to change any management/surveillance pertaining to her bone health Agree with Ophthalmology evaluation Continue dental care Prompt Neurology referral if seizures suspected Baseline Cardiology evaluation and renal ultrasound can be done out of precaution given associations for cardiac/GU anomalies but this is moreso seen in people with larger gene deletions (which Donelle does not have) We will ask about any adult PCPs that have experience in caring for adults with developmental disabilities  Shalunda and her family do not require routine f/u with  genetics given this finding. If we learn new information about SATB2 in the future, we will notify the family.  We also encouraged the family to join the [place] registry run by [facility] ([email or web address]).   Heidi Dach, MS, [facility] Certified Genetic Counselor  Artist Pais,  D.O. Attending Physician Medical Genetics Date: 01/26/2021 Time: 4:18pm  Total time spent: 45 minutes Time spent includes face to face and non-face to face care for the patient on the date of this encounter (history and physical, genetic counseling, coordination of care, data gathering and/or documentation as outlined)

## 2021-01-25 ENCOUNTER — Ambulatory Visit (INDEPENDENT_AMBULATORY_CARE_PROVIDER_SITE_OTHER): Payer: Medicaid Other | Admitting: Pediatric Genetics

## 2021-01-25 ENCOUNTER — Other Ambulatory Visit: Payer: Self-pay

## 2021-01-25 DIAGNOSIS — Z1589 Genetic susceptibility to other disease: Secondary | ICD-10-CM

## 2021-01-25 NOTE — Patient Instructions (Signed)
[facility] we are committed to providing exceptional care. You will receive a patient satisfaction survey through text or email regarding your visit today. Your opinion is important to me. Comments are appreciated.  

## 2021-01-26 DIAGNOSIS — Z1589 Genetic susceptibility to other disease: Secondary | ICD-10-CM | POA: Insufficient documentation

## 2021-03-19 IMAGING — CT CT ABD-PELV W/ CM
2 of 4 series · 16 of 46 positions shown, 18 images · IV contrast (omnipaque)
Comparison: None.

CLINICAL DATA: Abdominal pain and fever

EXAM:
CT ABDOMEN AND PELVIS WITH CONTRAST
TECHNIQUE: Multidetector CT imaging of the abdomen and pelvis was performed
using the standard protocol following bolus administration of
intravenous contrast.
CONTRAST:  80mL OMNIPAQUE IOHEXOL 300 MG/ML  SOLN

[Series 2: axial st · axial · 0.60mm/px · z∈[-652,-278]mm · 13 of 83 slices shown, 15 images]
[im 4/83  soft-tissue]
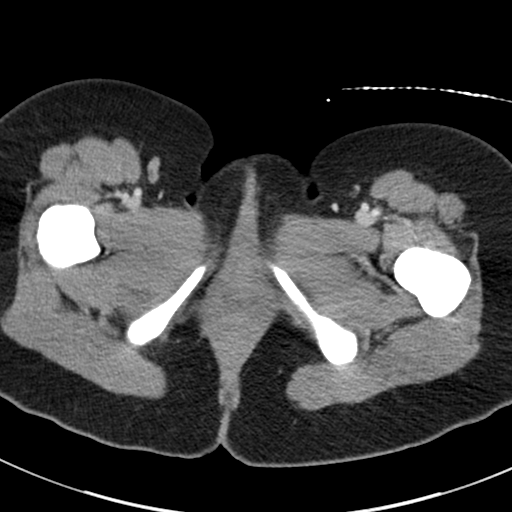
[im 4/83  bone]
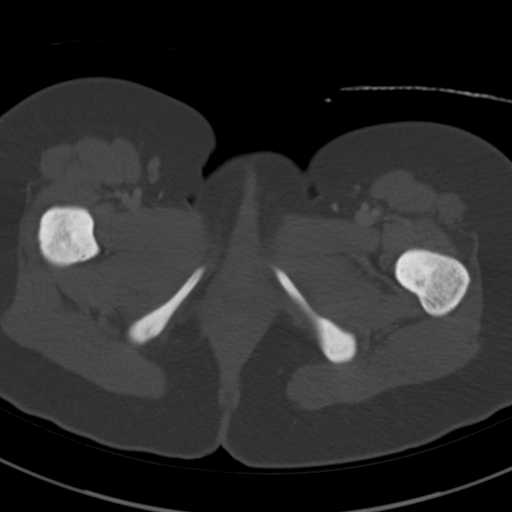
[im 11/83  soft-tissue]
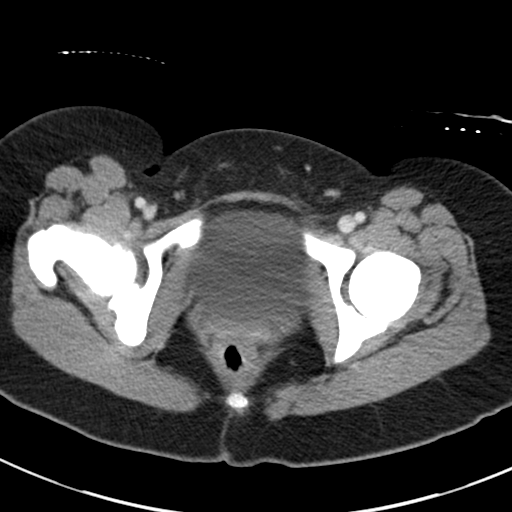
[im 18/83  soft-tissue]
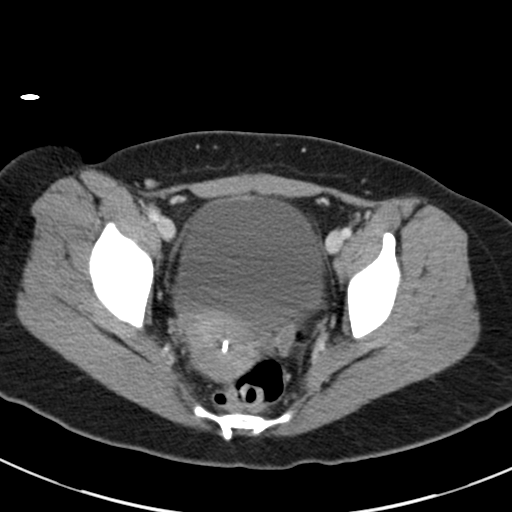
[im 22/83  soft-tissue]
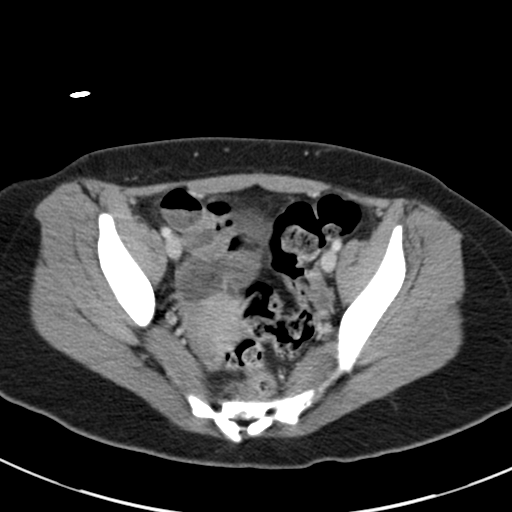
[im 29/83  soft-tissue]
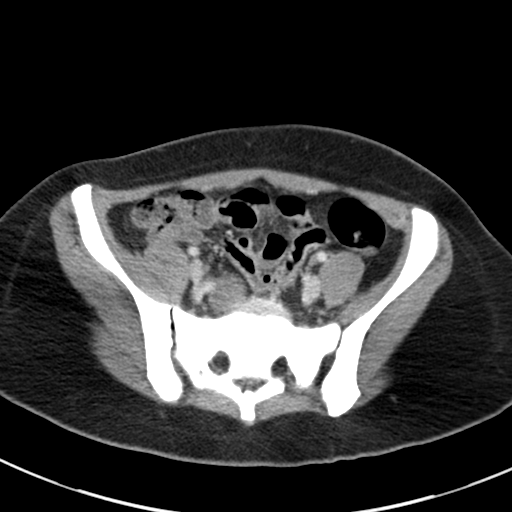
[im 36/83  soft-tissue]
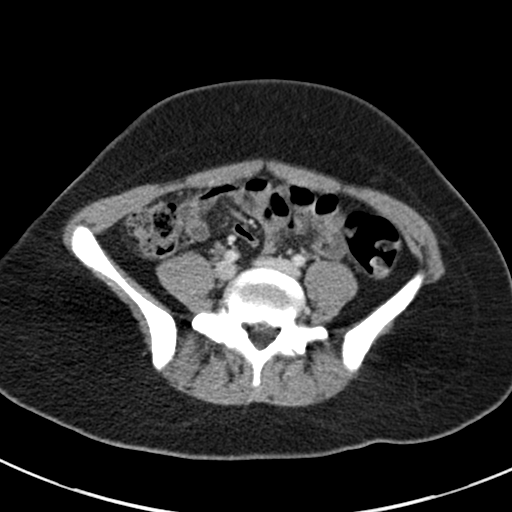
[im 43/83  soft-tissue]
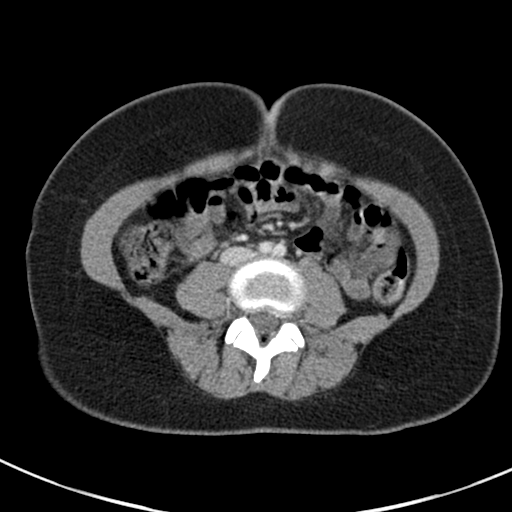
[im 47/83  soft-tissue]
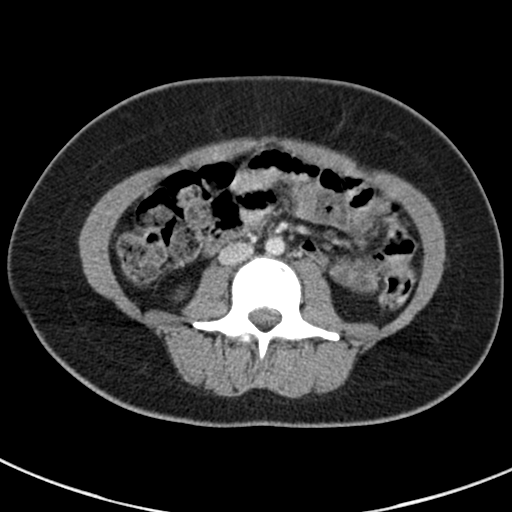
[im 54/83  soft-tissue]
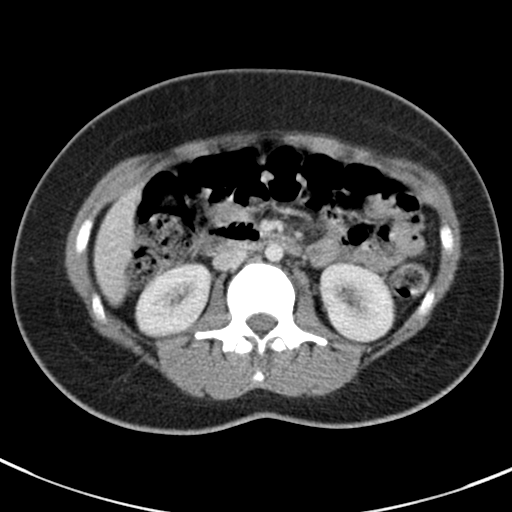
[im 54/83  bone]
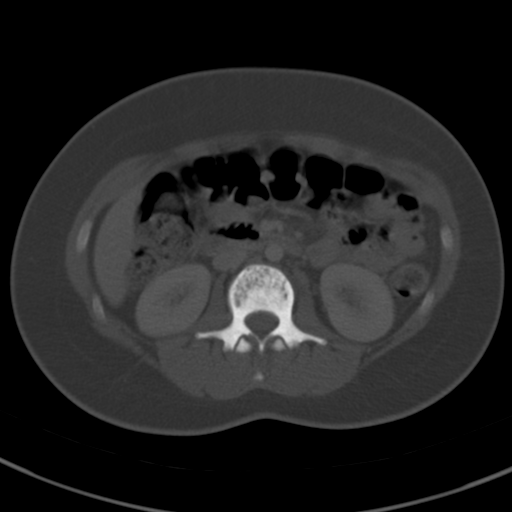
[im 61/83  soft-tissue]
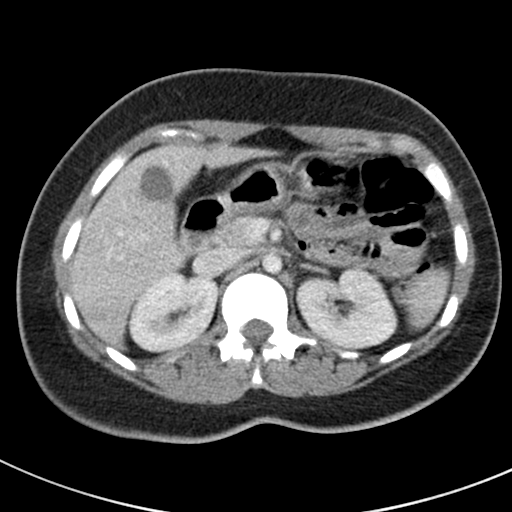
[im 65/83  soft-tissue]
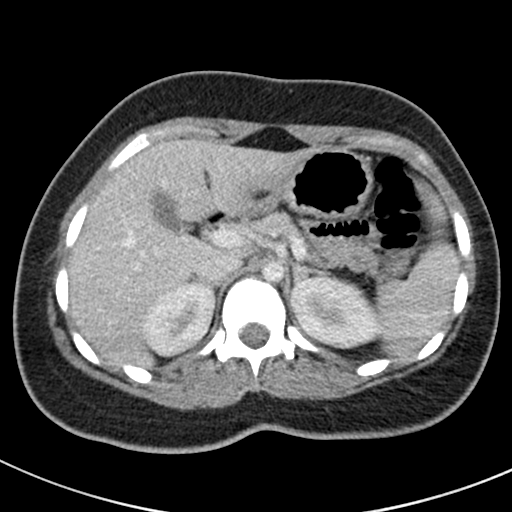
[im 72/83  soft-tissue]
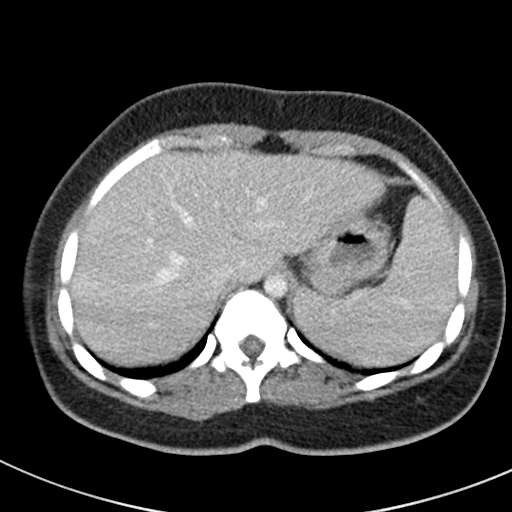
[im 79/83  soft-tissue]
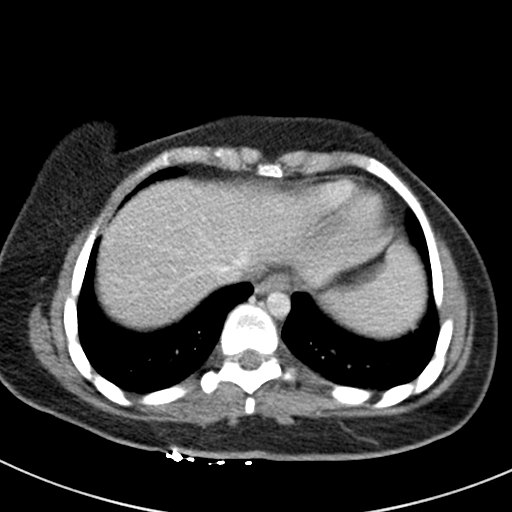

[Series 4: coronal st · coronal · 0.65mm/px · 3 of 118 slices shown]
[im 40/118  soft-tissue]
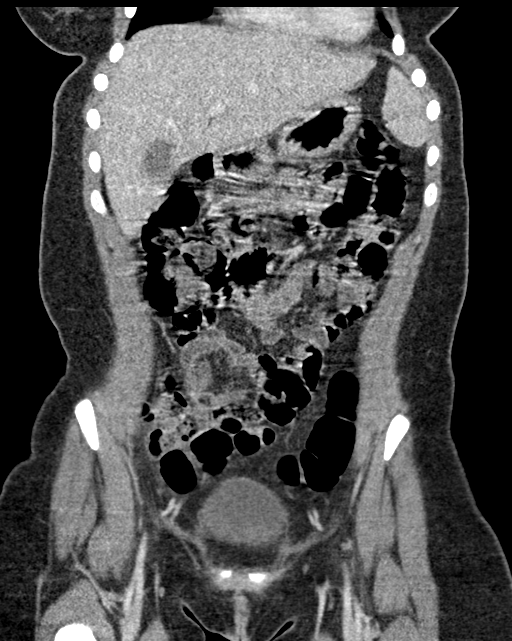
[im 53/118  soft-tissue]
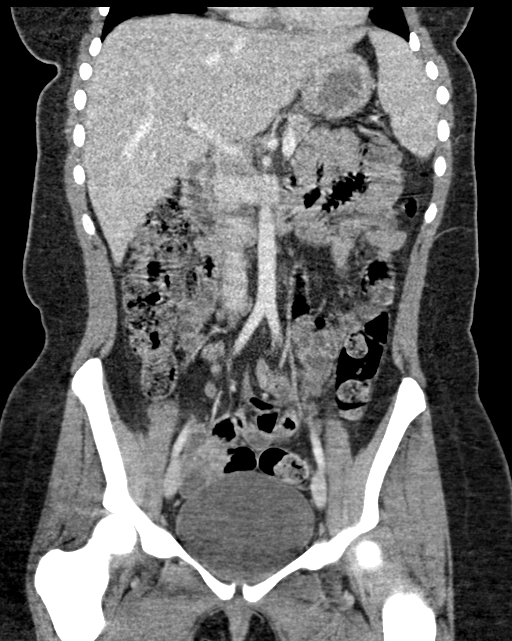
[im 66/118  soft-tissue]
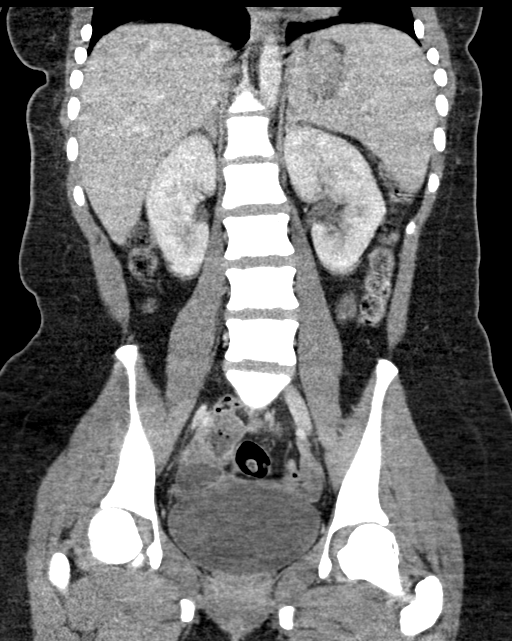

[16 of 46 positions shown; findings below may reference images not displayed]

FINDINGS: LOWER CHEST: Normal.

HEPATOBILIARY: Normal hepatic contours. No intra- or extrahepatic
biliary dilatation. The gallbladder is normal.

PANCREAS: Normal pancreas. No ductal dilatation or peripancreatic
fluid collection.

SPLEEN: Normal.

ADRENALS/URINARY TRACT: The adrenal glands are normal. No
hydronephrosis, nephroureterolithiasis or solid renal mass. The
urinary bladder is normal for degree of distention

STOMACH/BOWEL: There is no hiatal hernia. Normal duodenal course and
caliber. No small bowel dilatation or inflammation. No focal colonic
abnormality. Normal appendix.

VASCULAR/LYMPHATIC: Normal course and caliber of the major abdominal
vessels. No abdominal or pelvic lymphadenopathy.

REPRODUCTIVE: There is a T-shaped contraceptive device within the
uterus.

MUSCULOSKELETAL. Diffusely sclerotic appearance of the skeleton.

OTHER: None.
IMPRESSION: 1. No acute abnormality of the abdomen or pelvis.
2. Diffusely sclerotic appearance of the skeleton, possibly renal
osteodystrophy or hyperparathyroidism.

## 2021-04-09 ENCOUNTER — Other Ambulatory Visit (INDEPENDENT_AMBULATORY_CARE_PROVIDER_SITE_OTHER): Payer: Self-pay | Admitting: Family

## 2021-04-12 ENCOUNTER — Ambulatory Visit (INDEPENDENT_AMBULATORY_CARE_PROVIDER_SITE_OTHER): Payer: Medicaid Other | Admitting: Family

## 2021-04-17 ENCOUNTER — Other Ambulatory Visit: Payer: Self-pay

## 2021-04-17 ENCOUNTER — Encounter (INDEPENDENT_AMBULATORY_CARE_PROVIDER_SITE_OTHER): Payer: Self-pay | Admitting: Family

## 2021-04-17 ENCOUNTER — Ambulatory Visit (INDEPENDENT_AMBULATORY_CARE_PROVIDER_SITE_OTHER): Payer: Medicaid Other | Admitting: Family

## 2021-04-17 VITALS — BP 118/70 | HR 101 | Wt 143.0 lb

## 2021-04-17 DIAGNOSIS — E10649 Type 1 diabetes mellitus with hypoglycemia without coma: Secondary | ICD-10-CM

## 2021-04-17 DIAGNOSIS — E109 Type 1 diabetes mellitus without complications: Secondary | ICD-10-CM

## 2021-04-17 DIAGNOSIS — E1065 Type 1 diabetes mellitus with hyperglycemia: Secondary | ICD-10-CM

## 2021-04-17 DIAGNOSIS — Z794 Long term (current) use of insulin: Secondary | ICD-10-CM | POA: Diagnosis not present

## 2021-04-17 DIAGNOSIS — R748 Abnormal levels of other serum enzymes: Secondary | ICD-10-CM | POA: Diagnosis not present

## 2021-04-17 LAB — POCT GLYCOSYLATED HEMOGLOBIN (HGB A1C): Hemoglobin A1C: 6.5 % — AB (ref 4.0–5.6)

## 2021-04-17 LAB — POCT GLUCOSE (DEVICE FOR HOME USE): POC Glucose: 176 mg/dl — AB (ref 70–99)

## 2021-04-17 NOTE — Progress Notes (Signed)
Pediatric Endocrinology Diabetes Consultation Follow-up Visit  Melanie Morrow 06/21/2000 [id number]  Chief Complaint: Follow-up type 1 diabetes   [name], FNP   HPI: Melanie Morrow  is a 21 y.o. female presenting for follow-up of type 1 diabetes. she is accompanied to this visit by her mother.  26. Melanie Morrow is a 59 y.o. with history of pervasive developmental disorder (also non-verbal) who was diagnosed with T1DM on 08/22/16 after presenting to her PCP with polyuria, polydipsia, weight loss and hyperglycemia.  At PCP's office she had BG of 238 with UA showing 2+ glucose and 2+ ketones.  She was admitted to [facility] pediatric floor where she was not in DKA.  She was started on an MDI regimen.  TFTs on admission were normal with negative ICA Ab and negative insulin Ab; GAD Ab were positive.  C-peptide was low at 1.9.  A1c was 10%.  2. Since last visit on 12/2020 she has been well.    Melanie Morrow reports has been doing well overall and enjoyed her holiday breaks. She continues to go to school in self contained school in [employer], she will have one more year.   Mom reports that they have had issues with blood sugars in the afternoon when she gets home from school. She gets her lunchtime insulin around 1pm and then a 430, she is hypoglycemic. Mom is also concerned that the school may be giving her to much insulin when she does not eat her entire meal. She does not tend to go low on the weekends. Melanie Morrow is unable to communicate when she feels low. She wears Dexcom CGM which is working well.   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   Insulin regimen: Lantus 16 units  . Novolog 150/50/10 half unit plan  Hypoglycemia: Few lows since last visit, no glucagon needed. CGM Download: Using dexcom   Med-alert ID: Wearing a new bracelet today Injection sites: arms, legs and abdomen. Rotating.  Uses arms for CGM  Annual labs due: 2023 Ophthalmology due: 2023   ROS:   All systems reviewed with pertinent positives listed below; otherwise negative. Constitutional:Sleeping well. Weight is stable.  Eyes; no vision changes. No blurry vision.  HENT: No difficulty swallowing. No neck pain  Respiratory: No increased work of breathing currently GI: No constipation or diarrhea GU: Occasional spotting. Has IUD.  Musculoskeletal: No joint deformity Neuro: Normal affect. No tremors or headaches.  Endocrine: As above    Past Medical History:   Past Medical History:  Diagnosis Date   Cognitive impairment    Development delay    Has been evaluated by genetics and has normal chromosomes   Dyspraxia    Non-verbal learning disorder    Type 1 diabetes mellitus ([place]) 08/22/2016   +GAD Ab, neg insulin Ab and ICA Ab    Medications:  Outpatient Encounter Medications as of 04/17/2021  Medication Sig   Continuous Blood Gluc Sensor (DEXCOM G6 SENSOR) MISC Change sensor every 10 days   Continuous Blood Gluc Transmit (DEXCOM G6 TRANSMITTER) MISC Use with Dexcom sensors   Glucagon, rDNA, (GLUCAGON EMERGENCY) 1 MG KIT FOLLOW PACKAGE DIRECTIONS FOR LOW BLOOD SUGAR.   insulin glargine (LANTUS SOLOSTAR) 100 UNIT/ML Solostar Pen INJECT UP TO 50 UNITS SUBCUTANEOUSLY DAILY   Norethin Ace-Eth Estrad-FE 1-20 MG-MCG(24) CHEW Chew by mouth.   NOVOLOG FLEXPEN 100 UNIT/ML FlexPen INJECT UP TO 50 UNITS OF INSULIN PER DAY.   acetaminophen (TYLENOL CHILDRENS) 160 MG/5ML suspension Take 20.3 mLs (650 mg  total) by mouth every 6 (six) hours as needed for moderate pain or fever. (Patient not taking: Reported on 10/26/2019)   Alcohol Swabs (ALCOHOL PADS) 70 % PADS 6 wipes per day (Patient not taking: Reported on 04/17/2021)   BD PEN NEEDLE NANO U/F 32G X 4 MM MISC USE TO INJECT INSULIN 6X DAILY (Patient not taking: Reported on 04/17/2021)   diazePAM 5 MG/5ML SOLN Take by mouth. (Patient not taking: Reported on 11/02/2020)   ibuprofen (ADVIL) 100 MG/5ML suspension Take 20 mLs (400 mg total) by  mouth every 6 (six) hours as needed for fever. (Patient not taking: Reported on 10/26/2019)   levonorgestrel (MIRENA) 20 MCG/24HR IUD 1 each by Intrauterine route once.   levonorgestrel (MIRENA) 20 MCG/DAY IUD by Intrauterine route. (Patient not taking: Reported on 10/11/2020)   No facility-administered encounter medications on file as of 04/17/2021.    Allergies: Allergies  Allergen Reactions   Penicillins Hives   Amoxicillin Hives and Rash    Surgical History: Past Surgical History:  Procedure Laterality Date   DILATION AND CURETTAGE, DIAGNOSTIC / THERAPEUTIC     INTRAUTERINE DEVICE (IUD) INSERTION     MULTIPLE TOOTH EXTRACTIONS Bilateral     Family History:  Family History  Problem Relation Age of Onset   Diabetes Mother    Hyperlipidemia Mother    Diabetes Maternal Uncle    Diabetes Maternal Grandfather    Dementia Maternal Grandfather    Hyperlipidemia Maternal Grandfather    Hypertension Maternal Grandfather    Hypertension Paternal Grandmother    No family history of T1DM or hypothyroidism   Social History: Lives with: parents, grandparents also involved in her care   Physical Exam:  Vitals:   04/17/21 0859  BP: 118/70  Pulse: (!) 101  Weight: 143 lb (64.9 kg)      BP 118/70 (BP Location: Left Arm, Patient Position: Sitting, Cuff Size: Normal)    Pulse (!) 101    Wt 143 lb (64.9 kg)    BMI 27.32 kg/m  Body mass index: body mass index is 27.32 kg/m. Growth percentile SmartLinks can only be used for patients less than 71 years old.  Ht Readings from Last 3 Encounters:  10/26/19 5' 0.67" (1.541 m) (8 %, Z= -1.42)*  10/07/19 5' 0.5" (1.537 m) (7 %, Z= -1.49)*  07/22/19 5' 0.63" (1.54 m) (8 %, Z= -1.43)*   * Growth percentiles are based on CDC (Girls, 2-20 Years) data.   Wt Readings from Last 3 Encounters:  04/17/21 143 lb (64.9 kg)  01/11/21 141 lb 3.2 oz (64 kg)  11/02/20 142 lb 3.2 oz (64.5 kg)   General: Well developed, well nourished female in  no acute distress.   Head: Normocephalic, atraumatic.   Eyes:  Pupils equal and round. EOMI.   Sclera white.  No eye drainage.   Ears/Nose/Mouth/Throat: Nares patent, no nasal drainage.  Normal dentition, mucous membranes moist.   Neck: supple, no cervical lymphadenopathy, no thyromegaly Cardiovascular: regular rate, normal S1/S2, no murmurs Respiratory: No increased work of breathing.  Lungs clear to auscultation bilaterally.  No wheezes. Abdomen: soft, nontender, nondistended. Normal bowel sounds.  No appreciable masses  Extremities: warm, well perfused, cap refill < 2 sec.   Musculoskeletal: Normal muscle mass.  Normal strength Skin: warm, dry.  No rash or lesions. Neurologic: alert and oriented, normal speech, no tremor    Labs:   Results for orders placed or performed in visit on 04/17/21  POCT glycosylated hemoglobin (Hb A1C)  Result Value  Ref Range   Hemoglobin A1C 6.5 (A) 4.0 - 5.6 %   HbA1c POC (<> result, manual entry)     HbA1c, POC (prediabetic range)     HbA1c, POC (controlled diabetic range)    POCT Glucose (Device for Home Use)  Result Value Ref Range   Glucose Fasting, POC     POC Glucose 176 (A) 70 - 99 mg/dl     Assessment/Plan: Melanie Morrow is a 21 y.o. female with controlled type 1 diabetes on MDI and Dexcom CGM. She is having a pattern of hypoglycemia between 3-4pm, will reduce lunch carb ratio. Hemoglobin A1c is 6.5% which meets ADA goal of <7%.   1-4. Controlled diabetes mellitus type 1 without complications (HCC)/Adjustment reaction/Developmental delay/hypoglycemia unawareness - Lantus 16 units  -  Novolog 150/50/10 plan add 1 unit to breakfast -  Novolog at lunch 150/50/15 plan while at school.  - - Reviewed  CGM download. Discussed trends and patterns.  - Rotate injection sites to prevent scar tissue.  - bolus 15 minutes prior to eating to limit blood sugar spikes.  - Reviewed carb counting and importance of accurate carb counting.  - Discussed signs  and symptoms of hypoglycemia. Always have glucose available.  - POCT glucose and hemoglobin A1c  - Reviewed growth chart.    5. Elevated alkaline phosphatase  - [facility] with [facility] for evaluation and management.  - Continue vitmain D drops 4000 units daily  - Essentially, Melanie Morrow is a 21yo nonverbal, mobile female with T1DM who has had persistently elevated alkaline phosphatase (700-800 range) over the past several years, initially with vitamin D deficiency that has now been corrected. She presented to [facility] ED with abdominal pain and had an abdominal CT which showed Diffusely sclerotic appearance of the skeleton, possibly renal osteodystrophy or hyperparathyroidism. 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   Mom denies recent fractures or concerns of bone pain.  Discussed her case anonymously with my adult endocrinology colleague (Dr. Cruzita Lederer), who recommended referral to Dr. Edmonia James with [facility].   Influenza vaccine given. Counseling provided.   Follow-up:  3 months.    >45 spent today reviewing the medical chart, counseling the patient/family, and documenting today's visit.   When a patient is on insulin, intensive monitoring of blood glucose levels is necessary to avoid hyperglycemia and hypoglycemia. Severe hyperglycemia/hypoglycemia can lead to hospital admissions and be life threatening.      Hermenia Bers,  FNP-C  Pediatric Specialist  [address] [place]  [place], [address]  Tele: [phone]

## 2021-04-17 NOTE — Patient Instructions (Addendum)
It was a pleasure seeing you in clinic today. Please do not hesitate to contact me if you have questions or concerns.  ° °Please sign up for MyChart. This is a communication tool that allows you to send an email directly to me. This can be used for questions, prescriptions and blood sugar reports. We will also release labs to you with instructions on MyChart. Please do not use MyChart if you need immediate or emergency assistance. Ask our wonderful front office staff if you need assistance.  ° °

## 2021-04-17 NOTE — Progress Notes (Signed)
[facility]  [address], [address] [place], [place] [address] Telephone [phone]     Fax [phone]          Rapid-Acting Insulin Instructions (Novolog/Humalog/Apidra) (Target blood sugar 150, Insulin Sensitivity Factor 50, Insulin to Carbohydrate Ratio 1 unit for 15g)   SECTION A (Meals): 1. At mealtimes, take rapid-acting insulin according to this "Two-Component Method".  a. Measure Fingerstick Blood Glucose (or use reading on continuous glucose monitor) 0-15 minutes prior to the meal. Use the "Correction Dose Table" below to determine the dose of rapid-acting insulin needed to bring your blood sugar down to a baseline of 150. You can also calculate this dose with the following equation: (Blood sugar - target blood sugar) divided by 50.  Correction Dose Table Blood Sugar Rapid-acting Insulin units  Blood Sugar Rapid-acting Insulin units  < 100 (-) 1  351-400 5  101-150 0  401-450 6  151-200 1  451-500 7  201-250 2  501-550 8  251-300 3  551-600 9  301-350 4  Hi (>600) 10   b. Estimate the number of grams of carbohydrates you will be eating (carb count). Use the "Food Dose Table" below to determine the dose of rapid-acting insulin needed to cover the carbs in the meal. You can also calculate this dose using this formula: Total carbs divided by 15.  Food Dose Table  Grams of Carbs Rapid-acting Insulin units  Grams of Carbs Rapid-acting Insulin units  0-10 0  76-90        6  11-15 1  91-105        7  16-30 2  106-120        8  31-45 3  121-135        9  46-60 4  136-150       10  61-75 5  >150       11   c. Add up the Correction Dose plus the Food Dose = "Total Dose" of rapid-acting insulin to be taken. d. If you know the number of carbs you will eat, take the rapid-acting insulin 0-15 minutes prior to the meal; otherwise take the insulin immediately after the meal.    SECTION B (Bedtime/2AM): 1. Wait at least 2.5-3 hours after taking  your supper rapid-acting insulin before you do your bedtime blood sugar test. Based on your blood sugar, take a "bedtime snack" according to the table below. These carbs are "Free". You don't have to cover those carbs with rapid-acting insulin.  If you want a snack with more carbs than the "bedtime snack" table allows, subtract the free carbs from the total amount of carbs in the snack and cover this carb amount with rapid-acting insulin based on the Food Dose Table from Page 1.  Use the following column for your bedtime snack: ___________________  Bedtime Carbohydrate Snack Table  Blood Sugar Large Medium Small Very Small  < 76         60 gms         50 gms         40 gms    30 gms       76-100         50 gms         40 gms         30 gms    20 gms     101-150         40 gms           30 gms         20 gms    10 gms     151-199         30 gms         20gms                       10 gms      0    200-250         20 gms         10 gms           0      0    251-300         10 gms           0           0      0      > 300           0           0                    0      0   2. If the blood sugar at bedtime is above 200, no snack is needed (though if you do want a snack, cover the entire amount of carbs based on the Food Dose Table on page 1). You will need to take additional rapid-acting insulin based on the Bedtime Sliding Scale Dose Table below.  Bedtime Sliding Scale Dose Table Blood Sugar Rapid-acting Insulin units  <200 0  201-250 1  251-300 2  301-350 3  351-400 4  401-450 5  451-500 6  > 500 7   3. Then take your usual dose of long-acting insulin (Lantus, Basaglar, Tresiba).  4. If we ask you to check your blood sugar in the middle of the night (2AM-3AM), you should wait at least 3 hours after your last rapid-acting insulin dose before you check the blood sugar.  You will then use the Bedtime Sliding Scale Dose Table to give additional units of rapid-acting insulin if blood sugar is  above 200. This may be especially necessary in times of sickness, when the illness may cause more resistance to insulin and higher blood sugar than usual.  Michael Brennan, MD, CDE Signature: _____________________________________ Jennifer Badik, MD   Ashley Jessup, MD    Teegan Guinther, NP  Date: ______________  

## 2021-05-09 ENCOUNTER — Encounter (INDEPENDENT_AMBULATORY_CARE_PROVIDER_SITE_OTHER): Payer: Self-pay

## 2021-05-10 ENCOUNTER — Other Ambulatory Visit (INDEPENDENT_AMBULATORY_CARE_PROVIDER_SITE_OTHER): Payer: Self-pay

## 2021-05-10 DIAGNOSIS — E1065 Type 1 diabetes mellitus with hyperglycemia: Secondary | ICD-10-CM

## 2021-05-10 MED ORDER — DEXCOM G6 SENSOR MISC: 5 refills | Status: DC

## 2021-07-16 ENCOUNTER — Encounter (INDEPENDENT_AMBULATORY_CARE_PROVIDER_SITE_OTHER): Payer: Self-pay | Admitting: Family

## 2021-07-16 ENCOUNTER — Ambulatory Visit (INDEPENDENT_AMBULATORY_CARE_PROVIDER_SITE_OTHER): Payer: Medicaid Other | Admitting: Family

## 2021-07-16 VITALS — BP 112/70 | HR 82 | Wt 141.2 lb

## 2021-07-16 DIAGNOSIS — R748 Abnormal levels of other serum enzymes: Secondary | ICD-10-CM | POA: Diagnosis not present

## 2021-07-16 DIAGNOSIS — Z79899 Other long term (current) drug therapy: Secondary | ICD-10-CM

## 2021-07-16 DIAGNOSIS — E559 Vitamin D deficiency, unspecified: Secondary | ICD-10-CM

## 2021-07-16 DIAGNOSIS — E1065 Type 1 diabetes mellitus with hyperglycemia: Secondary | ICD-10-CM | POA: Diagnosis not present

## 2021-07-16 LAB — POCT GLUCOSE (DEVICE FOR HOME USE): POC Glucose: 265 mg/dl — AB (ref 70–99)

## 2021-07-16 LAB — POCT GLYCOSYLATED HEMOGLOBIN (HGB A1C): Hemoglobin A1C: 6.6 % — AB (ref 4.0–5.6)

## 2021-07-16 MED ORDER — DEXCOM G7 SENSOR MISC: 11 refills | Status: DC

## 2021-07-16 MED ORDER — DEXCOM G7 RECEIVER DEVI: 1 refills | Status: DC

## 2021-07-16 MED ORDER — DEXCOM G6 TRANSMITTER MISC: 1 refills | Status: DC

## 2021-07-16 MED ORDER — DEXCOM G6 SENSOR MISC: 5 refills | Status: DC

## 2021-07-16 NOTE — Patient Instructions (Signed)
It was a pleasure seeing you in clinic today. Please do not hesitate to contact me if you have questions or concerns.  ? ?Please sign up for MyChart. This is a communication tool that allows you to send an email directly to me. This can be used for questions, prescriptions and blood sugar reports. We will also release labs to you with instructions on MyChart. Please do not use MyChart if you need immediate or emergency assistance. Ask our wonderful front office staff if you need assistance.  ? ?- 16 units of lantus  ?- Continue novolog per plan  ?- labs today.  ?

## 2021-07-16 NOTE — Progress Notes (Signed)
Pediatric Endocrinology Diabetes Consultation Follow-up Visit ? ?Melanie Morrow ?Aug 04, 2000 ?[id number] ? ?Chief Complaint: Follow-up type 1 diabetes ? ? ?Melanie Brooklyn, FNP ? ? ?HPI: ?Melanie Morrow  is a 21 y.o. female presenting for follow-up of type 1 diabetes. she is accompanied to this visit by her mother. ? ?1. Melanie Morrow is a 64 y.o. with history of pervasive developmental disorder (also non-verbal) who was diagnosed with T1DM on 08/22/16 after presenting to her PCP with polyuria, polydipsia, weight loss and hyperglycemia.  At PCP's office she had BG of 238 with UA showing 2+ glucose and 2+ ketones.  She was admitted to [facility] pediatric floor where she was not in DKA.  She was started on an MDI regimen.  TFTs on admission were normal with negative ICA Ab and negative insulin Ab; GAD Ab were positive.  C-peptide was low at 1.9.  A1c was 10%. ? ?2. Since last visit on 03/2021 she has been well.   ? ?Mom reports that Melanie Morrow is doing better with her blood sugars since adjustments were made at her last visit for lunch. She has only had one low blood sugar during school since change was made. She is wearing Dexcom G6 which is working well for her overall. Family is doing well with carb counting since she continues to eat the same thing each day. Mom is a little concerned about lack of activity, she has been trying to increase, her weight is stable. Hypoglycemia has been rare, none severe. She is not able to communicate when she is feeling low.  ? 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 She recently saw [name] which did not "find anything out of the ordinary that could be contributing". As long as she is having fractures or falls there was not recommendation for further testing. Plan to monitor alk phos every 6 months.  ? ?Insulin regimen: Lantus 16 units  . Novolog 150/50/10 half unit plan. Uses 150/50/15 at lunch.  ?Hypoglycemia: Few lows since last visit, no glucagon  needed. ?CGM Download: Using dexcom ? ? ?Med-alert ID: Wearing a new bracelet today ?Injection sites: arms, legs and abdomen. Rotating.  Uses arms for CGM  ?Annual labs due: 2023 ?Ophthalmology due: 2023 ?  ?ROS:  ?All systems reviewed with pertinent positives listed below; otherwise negative. ?Constitutional:Sleeping well. Weight is stable.  ?Eyes; no vision changes. No blurry vision.  ?HENT: No difficulty swallowing. No neck pain  ?Respiratory: No increased work of breathing currently ?GI: No constipation or diarrhea ?GU: Occasional spotting. Has IUD.  ?Musculoskeletal: No joint deformity ?Neuro: Normal affect. No tremors or headaches.  ?Endocrine: As above ? ? ? ?Past Medical History:   ?Past Medical History:  ?Diagnosis Date  ? Cognitive impairment   ? Development delay   ? Has been evaluated by genetics and has normal chromosomes  ? Dyspraxia   ? Non-verbal learning disorder   ? Type 1 diabetes mellitus ([place]) 08/22/2016  ? +GAD Ab, neg insulin Ab and ICA Ab  ? ? ?Medications:  ?Outpatient Encounter Medications as of 07/16/2021  ?Medication Sig  ? Glucagon, rDNA, (GLUCAGON EMERGENCY) 1 MG KIT FOLLOW PACKAGE DIRECTIONS FOR LOW BLOOD SUGAR.  ? insulin glargine (LANTUS SOLOSTAR) 100 UNIT/ML Solostar Pen INJECT UP TO 50 UNITS SUBCUTANEOUSLY DAILY  ? Norethin Ace-Eth Estrad-FE 1-20 MG-MCG(24) CHEW Chew by mouth.  ? NOVOLOG FLEXPEN 100 UNIT/ML FlexPen INJECT UP TO 50 UNITS OF INSULIN PER DAY.  ? [DISCONTINUED] Continuous Blood Gluc Receiver (  [place]) [place] 1 receiver  ? [DISCONTINUED] Continuous Blood Gluc Sensor (DEXCOM G6 SENSOR) MISC Change sensor every 10 days  ? [DISCONTINUED] Continuous Blood Gluc Sensor (DEXCOM G7 SENSOR) MISC Change sensor every 10 days.  ? [DISCONTINUED] Continuous Blood Gluc Transmit (DEXCOM G6 TRANSMITTER) MISC Use with Dexcom sensors  ? acetaminophen (TYLENOL CHILDRENS) 160 MG/5ML suspension Take 20.3 mLs (650 mg total) by mouth every 6 (six) hours as needed for moderate pain or  fever. (Patient not taking: Reported on 10/26/2019)  ? Alcohol Swabs (ALCOHOL PADS) 70 % PADS 6 wipes per day (Patient not taking: Reported on 04/17/2021)  ? BD PEN NEEDLE NANO U/F 32G X 4 MM MISC USE TO INJECT INSULIN 6X DAILY (Patient not taking: Reported on 04/17/2021)  ? Continuous Blood Gluc Sensor (DEXCOM G6 SENSOR) MISC Change sensor every 10 days  ? Continuous Blood Gluc Transmit (DEXCOM G6 TRANSMITTER) MISC Use with Dexcom sensors  ? diazePAM 5 MG/5ML SOLN Take by mouth. (Patient not taking: Reported on 11/02/2020)  ? ibuprofen (ADVIL) 100 MG/5ML suspension Take 20 mLs (400 mg total) by mouth every 6 (six) hours as needed for fever. (Patient not taking: Reported on 10/26/2019)  ? levonorgestrel (MIRENA) 20 MCG/24HR IUD 1 each by Intrauterine route once.  ? levonorgestrel (MIRENA) 20 MCG/DAY IUD by Intrauterine route. (Patient not taking: Reported on 10/11/2020)  ? ?No facility-administered encounter medications on file as of 07/16/2021.  ? ? ?Allergies: ?Allergies  ?Allergen Reactions  ? Penicillins Hives  ? Amoxicillin Hives and Rash  ? ? ?Surgical History: ?Past Surgical History:  ?Procedure Laterality Date  ? DILATION AND CURETTAGE, DIAGNOSTIC / THERAPEUTIC    ? INTRAUTERINE DEVICE (IUD) INSERTION    ? MULTIPLE TOOTH EXTRACTIONS Bilateral   ? ? ?Family History:  ?Family History  ?Problem Relation Age of Onset  ? Diabetes Mother   ? Hyperlipidemia Mother   ? Diabetes Maternal Uncle   ? Diabetes Maternal Grandfather   ? Dementia Maternal Grandfather   ? Hyperlipidemia Maternal Grandfather   ? Hypertension Maternal Grandfather   ? Hypertension Paternal Grandmother   ? ?No family history of T1DM or hypothyroidism ?  ?Social History: ?Lives with: parents, grandparents also involved in her care ? ? ?Physical Exam:  ?Vitals:  ? 07/16/21 0831  ?BP: 112/70  ?Pulse: 82  ?Weight: 141 lb 3.2 oz (64 kg)  ? ? ? ? ? ?BP 112/70 (BP Location: Right Arm, Patient Position: Sitting, Cuff Size: Normal)   Pulse 82   Wt 141 lb 3.2  oz (64 kg)   BMI 26.97 kg/m?  ?Body mass index: body mass index is 26.97 kg/m?Marland Kitchen ?Growth percentile SmartLinks can only be used for patients less than 38 years old. ? ?Ht Readings from Last 3 Encounters:  ?10/26/19 5' 0.67" (1.541 m) (8 %, Z= -1.42)*  ?10/07/19 5' 0.5" (1.537 m) (7 %, Z= -1.49)*  ?07/22/19 5' 0.63" (1.54 m) (8 %, Z= -1.43)*  ? ?* Growth percentiles are based on CDC (Girls, 2-20 Years) data.  ? ?Wt Readings from Last 3 Encounters:  ?07/16/21 141 lb 3.2 oz (64 kg)  ?04/17/21 143 lb (64.9 kg)  ?01/11/21 141 lb 3.2 oz (64 kg)  ? ?General: Well developed, well nourished, developmentally delayed female in no acute distress.   ?Head: Normocephalic, atraumatic.   ?Eyes:  Pupils equal and round. EOMI.   Sclera white.  No eye drainage.   ?Ears/Nose/Mouth/Throat: Nares patent, no nasal drainage.  Normal dentition, mucous membranes moist.   ?Neck: supple, no  cervical lymphadenopathy, no thyromegaly ?Cardiovascular: regular rate, normal S1/S2, no murmurs ?Respiratory: No increased work of breathing.  Lungs clear to auscultation bilaterally.  No wheezes. ?Abdomen: soft, nontender, nondistended. No appreciable masses  ?Extremities: warm, well perfused, cap refill < 2 sec.   ?Musculoskeletal: Normal muscle mass.  Normal strength ?Skin: warm, dry.  No rash or lesions. ?Neurologic: alert and oriented, no speech, but does some signs, no tremor ? ? ? ?Labs: ? ? ?Results for orders placed or performed in visit on 07/16/21  ?POCT glycosylated hemoglobin (Hb A1C)  ?Result Value Ref Range  ? Hemoglobin A1C 6.6 (A) 4.0 - 5.6 %  ? HbA1c POC (<> result, manual entry)    ? HbA1c, POC (prediabetic range)    ? HbA1c, POC (controlled diabetic range)    ?POCT Glucose (Device for Home Use)  ?Result Value Ref Range  ? Glucose Fasting, POC    ? POC Glucose 265 (A) 70 - 99 mg/dl  ? ? ? ?Assessment/Plan: ?Jya is a 20 y.o. female with controlled type 1 diabetes on MDI and Dexcom CGM. Shaia has excellent diabetes control using MDI.  Her Hemoglobin A1c is 6.6% which meets ADA goal of <7% with minimal hypoglycemia.  ? ?1-4. Controlled diabetes mellitus type 1 without complications (HCC)/Adjustment reaction/Developmental delay/hypoglyce

## 2021-07-17 LAB — COMPLETE METABOLIC PANEL WITHOUT GFR
AG Ratio: 1.4 (calc) (ref 1.0–2.5)
ALT: 11 U/L (ref 6–29)
AST: 11 U/L (ref 10–30)
Albumin: 4 g/dL (ref 3.6–5.1)
Alkaline phosphatase (APISO): 663 U/L — ABNORMAL HIGH (ref 31–125)
BUN: 12 mg/dL (ref 7–25)
CO2: 22 mmol/L (ref 20–32)
Calcium: 9.4 mg/dL (ref 8.6–10.2)
Chloride: 103 mmol/L (ref 98–110)
Creat: 0.62 mg/dL (ref 0.50–0.96)
Globulin: 2.8 g/dL (ref 1.9–3.7)
Glucose, Bld: 238 mg/dL — ABNORMAL HIGH (ref 65–139)
Potassium: 4.4 mmol/L (ref 3.5–5.3)
Sodium: 137 mmol/L (ref 135–146)
Total Bilirubin: 0.4 mg/dL (ref 0.2–1.2)
Total Protein: 6.8 g/dL (ref 6.1–8.1)
eGFR: 130 mL/min/1.73m2

## 2021-07-17 LAB — VITAMIN D 25 HYDROXY (VIT D DEFICIENCY, FRACTURES): Vit D, 25-Hydroxy: 57 ng/mL (ref 30–100)

## 2021-07-17 LAB — LIPID PANEL
Cholesterol: 237 mg/dL — ABNORMAL HIGH
HDL: 52 mg/dL
LDL Cholesterol (Calc): 159 mg/dL — ABNORMAL HIGH
Non-HDL Cholesterol (Calc): 185 mg/dL — ABNORMAL HIGH
Total CHOL/HDL Ratio: 4.6 (calc)
Triglycerides: 135 mg/dL

## 2021-07-17 LAB — TSH: TSH: 2.32 mIU/L

## 2021-07-17 LAB — T4, FREE: Free T4: 0.9 ng/dL (ref 0.8–1.8)

## 2021-07-19 LAB — MICROALBUMIN / CREATININE URINE RATIO
Creatinine, Urine: 114 mg/dL (ref 20–275)
Microalb Creat Ratio: 16 mcg/mg creat (ref <30)
Microalb, Ur: 1.8 mg/dL

## 2021-09-25 ENCOUNTER — Ambulatory Visit: Payer: Medicaid Other | Admitting: Family Medicine

## 2021-09-26 ENCOUNTER — Ambulatory Visit: Payer: Medicaid Other | Admitting: Family Medicine

## 2021-10-01 ENCOUNTER — Other Ambulatory Visit (INDEPENDENT_AMBULATORY_CARE_PROVIDER_SITE_OTHER): Payer: Self-pay | Admitting: Family

## 2021-10-09 ENCOUNTER — Ambulatory Visit: Payer: Medicaid Other | Admitting: Family Medicine

## 2021-10-09 ENCOUNTER — Other Ambulatory Visit (INDEPENDENT_AMBULATORY_CARE_PROVIDER_SITE_OTHER): Payer: Self-pay | Admitting: Family

## 2021-10-09 DIAGNOSIS — R6250 Unspecified lack of expected normal physiological development in childhood: Secondary | ICD-10-CM | POA: Insufficient documentation

## 2021-10-09 DIAGNOSIS — E559 Vitamin D deficiency, unspecified: Secondary | ICD-10-CM | POA: Insufficient documentation

## 2021-10-09 DIAGNOSIS — Q9359 Other deletions of part of a chromosome: Secondary | ICD-10-CM | POA: Insufficient documentation

## 2021-10-09 DIAGNOSIS — M9390 Osteochondropathy, unspecified of unspecified site: Secondary | ICD-10-CM | POA: Insufficient documentation

## 2021-10-15 DIAGNOSIS — Q999 Chromosomal abnormality, unspecified: Secondary | ICD-10-CM | POA: Insufficient documentation

## 2021-10-18 ENCOUNTER — Encounter (INDEPENDENT_AMBULATORY_CARE_PROVIDER_SITE_OTHER): Payer: Self-pay | Admitting: Family

## 2021-10-18 ENCOUNTER — Encounter (INDEPENDENT_AMBULATORY_CARE_PROVIDER_SITE_OTHER): Payer: Self-pay

## 2021-10-18 ENCOUNTER — Ambulatory Visit (INDEPENDENT_AMBULATORY_CARE_PROVIDER_SITE_OTHER): Payer: Medicaid Other | Admitting: Family

## 2021-10-18 VITALS — BP 112/70 | HR 100 | Wt 141.8 lb

## 2021-10-18 DIAGNOSIS — N926 Irregular menstruation, unspecified: Secondary | ICD-10-CM | POA: Insufficient documentation

## 2021-10-18 DIAGNOSIS — E1065 Type 1 diabetes mellitus with hyperglycemia: Secondary | ICD-10-CM | POA: Diagnosis not present

## 2021-10-18 DIAGNOSIS — E559 Vitamin D deficiency, unspecified: Secondary | ICD-10-CM

## 2021-10-18 DIAGNOSIS — R748 Abnormal levels of other serum enzymes: Secondary | ICD-10-CM

## 2021-10-18 DIAGNOSIS — Z79899 Other long term (current) drug therapy: Secondary | ICD-10-CM

## 2021-10-18 LAB — POCT GLYCOSYLATED HEMOGLOBIN (HGB A1C): Hemoglobin A1C: 6.5 % — AB (ref 4.0–5.6)

## 2021-10-18 LAB — POCT GLUCOSE (DEVICE FOR HOME USE): POC Glucose: 180 mg/dl — AB (ref 70–99)

## 2021-10-18 NOTE — Progress Notes (Signed)
Pediatric Specialists [facility] [address], [address], [place], [place] [address] Phone: [phone] Fax: [phone]                                          Diabetes Medical Management Plan                                               School Year [phone] - 2024 *This diabetes plan serves as a healthcare provider order, transcribe onto school form.   The nurse will teach school staff procedures as needed for diabetic care in the school.Melanie Morrow   DOB: 03/12/2001   School: _______________________________________________________________  Parent/Guardian: ___________________________phone #: _____________________  Parent/Guardian: ___________________________phone #: _____________________  Diabetes Diagnosis: Type 1 Diabetes  ______________________________________________________________________  Blood Glucose Monitoring   Target range for blood glucose is: 80-180 mg/dL  Times to check blood glucose level: Before meals, Before Physical Education, As needed for signs/symptoms, and Before dismissal of school  Student has a CGM (Continuous Glucose Monitor): Yes-Dexcom Student may use blood sugar reading from continuous glucose monitor to determine insulin dose.   CGM Alarms. If CGM alarm goes off and student is unsure of how to respond to alarm, student should be escorted to school nurse/school diabetes team member. If CGM is not working or if student is not wearing it, check blood sugar via fingerstick. If CGM is dislodged, do NOT throw it away, and return it to parent/guardian. CGM site may be reinforced with medical tape. If glucose remains low on CGM 15 minutes after hypoglycemia treatment, check glucose with fingerstick and glucometer.  It appears most diabetes technology has not been studied with use of Evolv Express body scanners. These Evolv Express body scanners seem to be most similar to body scanners at the airport.  Most diabetes technology  recommends against wearing a continuous glucose monitor or insulin pump in a body scanner or x-ray machine, therefore, [facility] pediatric specialist endocrinology providers do not recommend wearing a continuous glucose monitor or insulin pump through an Evolv Express body scanner. Hand-wanding, pat-downs, visual inspection, and walk-through metal detectors are OK to use.   Student's Self Care for Glucose Monitoring: dependent (needs supervision AND assistance) Self treats mild hypoglycemia: No  It is preferable to treat hypoglycemia in the classroom so student does not miss instructional time.  If the student is not in the classroom (ie at recess or specials, etc) and does not have fast sugar with them, then they should be escorted to the school nurse/school diabetes team member. If the student has a CGM and uses a cell phone as the reader device, the cell phone should be with them at all times.    Hypoglycemia (Low Blood Sugar) Hyperglycemia (High Blood Sugar)   Shaky                           Dizzy Sweaty                         Weakness/Fatigue Pale  Headache Fast Heart Beat            Blurry vision Hungry                         Slurred Speech Irritable/Anxious           Seizure  Complaining of feeling low or CGM alarms low  Frequent urination          Abdominal Pain Increased Thirst              Headaches           Nausea/Vomiting            Fruity Breath Sleepy/Confused            Chest Pain Inability to Concentrate Irritable Blurred Vision   Check glucose if signs/symptoms above Stay with child at all times Give 15 grams of carbohydrate (fast sugar) if blood sugar is less than 80 mg/dL, and child is conscious, cooperative, and able to swallow.  3-4 glucose tabs Half cup (4 oz) of juice or regular soda Check blood sugar in 15 minutes. If blood sugar does not improve, give fast sugar again If still no improvement after 2 fast sugars, call  parent/guardian. Call 911, parent/guardian and/or child's health care provider if Child's symptoms do not go away Child loses consciousness Unable to reach parent/guardian and symptoms worsen  If child is UNCONSCIOUS, experiencing a seizure or unable to swallow Place student on side  Administer glucagon (Baqsimi/Gvoke/Glucagon For Injection) depending on the dosage formulation prescribed to the patient.   Glucagon Formulation Dose  Baqsimi Regardless of weight: 3 mg intranasally   Gvoke Hypopen <45 kg/100 pounds: 0.5 mg/0.23mL subcutaneously > 45 kg/100 pounds: 1 mg/0.2 mL subcutaneously  Glucagon for injection <20 kg/45 lbs: 0.5 mg/0.5 mL subcutaneously >20 kg/lbs: 1 mg/1 mL subcutaneously   CALL 911, parent/guardian, and/or child's health care provider  *Pump- Review pump therapy guidelines Check glucose if signs/symptoms above Check Ketones if above 300 mg/dL after 2 glucose checks if ketone strips are available. Notify Parent/Guardian if glucose is over 300 mg/dL and patient has ketones in urine. Encourage water/sugar free fluids, allow unlimited use of bathroom Administer insulin as below if it has been over 3 hours since last insulin dose Recheck glucose in 2.5-3 hours CALL 911 if child Loses consciousness Unable to reach parent/guardian and symptoms worsen       8.   If moderate to large ketones or no ketone strips available to check urine ketones, contact parent.  *Pump Check pump function Check pump site Check tubing Treat for hyperglycemia as above Refer to Pump Therapy Orders              Do not allow student to walk anywhere alone when blood sugar is low or suspected to be low.  Follow this protocol even if immediately prior to a meal.    Insulin Therapy  -This section is for those who are on insulin injections OR those on an insulin pump who are experiencing issues with the insulin pump (back up plan)    Adjustable Insulin, 2 Component Method:  See actual  method below.  Two Component Method (Multiple Daily Injections) Novolog 150/50/15  Food DOSE (Carbohydrate Coverage): Number of Carbs Units of Rapid Acting Insulin  0-14 0  15-29 1  30-44 2  45-59 3  60-74 4  75-89 5  90-104 6  105-119 7  120-134 8  135-149 9  150-164  10  165-179 11  180-194 12  195+  (# carbs divided by 15)     Correction DOSE: Glucose (mg/dL) Units of Rapid Acting Insulin  Less than 150 0  151-200 1  201-250 2  251-300 3  301-350 4  351-400 5  401-450 6  451-500 7  501-550 8  551 or more 9     When to give insulin Breakfast: Carbohydrate coverage plus correction dose per attached plan when glucose is above 150mg /dl and 3 hours since last insulin dose Lunch: Carbohydrate coverage plus correction dose per attached plan when glucose is above 150mg /dl and 3 hours since last insulin dose Snack: Carbohydrate coverage only per attached plan  If a student is not hungry and will not eat carbs, then you do not have to give food dose. You can give solely correction dose IF blood glucose is greater than >150 mg/dL AND no rapid acting insulin in the past three hours.  Student's Self Care Insulin Administration Skills: dependent (needs supervision AND assistance)  If there is a change in the daily schedule (field trip, delayed opening, early release or class party), please contact parents for instructions.  Parents/Guardians Authorization to Adjust Insulin Dose: Yes:  Parents/guardians are authorized to increase or decrease insulin doses plus or minus 3 units.   Testing  ALL STUDENTS SHOULD HAVE A 504 PLAN or IHP (See 504/IHP for additional instructions).  The student may need to step out of the testing environment to take care of personal health needs (example:  treating low blood sugar or taking insulin to correct high blood sugar).   The student should be allowed to return to complete the remaining test pages, without a time penalty.   The student must  have access to glucose tablets/fast acting carbohydrates/juice at all times. The student will need to be within 20 feet of their CGM reader/phone, and insulin pump reader/phone.   SPECIAL INSTRUCTIONS:   I give permission to the school nurse, trained diabetes personnel, and other designated staff members of _________________________school to perform and carry out the diabetes care tasks as outlined by Diabetes Medical Management Plan.  I also consent to the release of the information contained in this Diabetes Medical Management Plan to all staff members and other adults who have custodial care of Arrayah Connors and who may need to know this information to maintain Melanie Morrow health and safety.       Physician Signature: Melanie Reining, NP               Date: 10/18/2021 Parent/Guardian Signature: _______________________  Date: ___________________

## 2021-10-18 NOTE — Patient Instructions (Signed)
It was a pleasure seeing you in clinic today. Please do not hesitate to contact me if you have questions or concerns.  ° °Please sign up for MyChart. This is a communication tool that allows you to send an email directly to me. This can be used for questions, prescriptions and blood sugar reports. We will also release labs to you with instructions on MyChart. Please do not use MyChart if you need immediate or emergency assistance. Ask our wonderful front office staff if you need assistance.  ° °

## 2021-10-18 NOTE — Progress Notes (Signed)
Pediatric Endocrinology Diabetes Consultation Follow-up Visit  Melanie Morrow 24-May-2000 [id number]  Chief Complaint: Follow-up type 1 diabetes   Melanie Brooklyn, FNP   HPI: Melanie Morrow  is a 21 y.o. female presenting for follow-up of type 1 diabetes. she is accompanied to this visit by her mother.  43. Melanie Morrow is a 29 y.o. with history of pervasive developmental disorder (also non-verbal) who was diagnosed with T1DM on 08/22/16 after presenting to her PCP with polyuria, polydipsia, weight loss and hyperglycemia.  At PCP's office she had BG of 238 with UA showing 2+ glucose and 2+ ketones.  She was admitted to [facility] pediatric floor where she was not in DKA.  She was started on an MDI regimen.  TFTs on admission were normal with negative ICA Ab and negative insulin Ab; GAD Ab were positive.  C-peptide was low at 1.9.  A1c was 10%.  2. Since last visit on 07/2021 she has been well.    She has had a good summer, plans to go to [place] for 1 week. They have diabetes supplies and travel plans ready. She will finishing her senior year of high school.   She is wearing Dexcom CGM, it is working well for her. Mom reports that blood sugars have been pretty good, she rarely has low blood sugars. Having some blood sugar spikes due to different diet changes over the summer. She takes novolog after eating, carb intake ranges with breakfast being carb heavy and dinner the least. Family is working on increasing activity but it has been difficult with heat levels lately.   At her last visit her cholesterol and LDL were high. Mom reports family history of high cholesterol but feels it is likely due to [employer].    Insulin regimen: Lantus 16 units  . Novolog 150/50/10 half unit plan. Uses 150/50/15 at lunch.  Hypoglycemia: Few lows since last visit, no glucagon needed. CGM Download: Using dexcom   Med-alert ID: Wearing a new bracelet today Injection sites: arms, legs and abdomen. Rotating.  Uses  arms for CGM  Annual labs due: 07/2022 Ophthalmology due: 2023   ROS:  All systems reviewed with pertinent positives listed below; otherwise negative. Constitutional:Sleeping well. Weight is stable.  Eyes; no vision changes. No blurry vision.  HENT: No difficulty swallowing. No neck pain  Respiratory: No increased work of breathing currently GI: No constipation or diarrhea GU: Occasional spotting. Has IUD.  Musculoskeletal: No joint deformity Neuro: Normal affect. No tremors or headaches.  Endocrine: As above    Past Medical History:   Past Medical History:  Diagnosis Date   Cognitive impairment    Development delay    Has been evaluated by genetics and has normal chromosomes   Dyspraxia    Non-verbal learning disorder    Type 1 diabetes mellitus ([place]) 08/22/2016   +GAD Ab, neg insulin Ab and ICA Ab    Medications:  Outpatient Encounter Medications as of 10/18/2021  Medication Sig   Alcohol Swabs (ALCOHOL PADS) 70 % PADS 6 wipes per day   BD PEN NEEDLE NANO U/F 32G X 4 MM MISC USE TO INJECT INSULIN 6X DAILY   Continuous Blood Gluc Sensor (DEXCOM G6 SENSOR) MISC Change sensor every 10 days   Continuous Blood Gluc Transmit (DEXCOM G6 TRANSMITTER) MISC Use with Dexcom sensors   LANTUS SOLOSTAR 100 UNIT/ML Solostar Pen INJECT UP TO 50 UNITS SUBCUTANEOUSLY DAILY   levonorgestrel (MIRENA) 20 MCG/DAY IUD by Intrauterine route.   Norethin Ace-Eth Estrad-FE 1-20 MG-MCG(24) CHEW  Chew by mouth.   NOVOLOG FLEXPEN 100 UNIT/ML FlexPen INJECT UP TO 50 UNITS OF INSULIN PER DAY.   acetaminophen (TYLENOL CHILDRENS) 160 MG/5ML suspension Take 20.3 mLs (650 mg total) by mouth every 6 (six) hours as needed for moderate pain or fever. (Patient not taking: Reported on 10/26/2019)   diazePAM 5 MG/5ML SOLN Take by mouth. (Patient not taking: Reported on 11/02/2020)   Glucagon, rDNA, (GLUCAGON EMERGENCY) 1 MG KIT FOLLOW PACKAGE DIRECTIONS FOR LOW BLOOD SUGAR. (Patient not taking: Reported on 10/18/2021)    ibuprofen (ADVIL) 100 MG/5ML suspension Take 20 mLs (400 mg total) by mouth every 6 (six) hours as needed for fever. (Patient not taking: Reported on 10/26/2019)   [DISCONTINUED] levonorgestrel (MIRENA) 20 MCG/24HR IUD 1 each by Intrauterine route once.   No facility-administered encounter medications on file as of 10/18/2021.    Allergies: Allergies  Allergen Reactions   Penicillins Hives   Amoxicillin Hives and Rash   Cefdinir Rash    Surgical History: Past Surgical History:  Procedure Laterality Date   DILATION AND CURETTAGE, DIAGNOSTIC / THERAPEUTIC     INTRAUTERINE DEVICE (IUD) INSERTION     MULTIPLE TOOTH EXTRACTIONS Bilateral     Family History:  Family History  Problem Relation Age of Onset   Diabetes Mother    Hyperlipidemia Mother    Diabetes Maternal Uncle    Diabetes Maternal Grandfather    Dementia Maternal Grandfather    Hyperlipidemia Maternal Grandfather    Hypertension Maternal Grandfather    Hypertension Paternal Grandmother    No family history of T1DM or hypothyroidism   Social History: Lives with: parents, grandparents also involved in her care   Physical Exam:  Vitals:   10/18/21 1545  BP: 112/70  Pulse: 100  Weight: 141 lb 12.8 oz (64.3 kg)        BP 112/70   Pulse 100   Wt 141 lb 12.8 oz (64.3 kg)   BMI 27.09 kg/m  Body mass index: body mass index is 27.09 kg/m. Growth %ile SmartLinks can only be used for patients less than 77 years old.  Ht Readings from Last 3 Encounters:  10/26/19 5' 0.67" (1.541 m) (8 %, Z= -1.42)*  10/07/19 5' 0.5" (1.537 m) (7 %, Z= -1.49)*  07/22/19 5' 0.63" (1.54 m) (8 %, Z= -1.43)*   * Growth percentiles are based on CDC (Girls, 2-20 Years) data.   Wt Readings from Last 3 Encounters:  10/18/21 141 lb 12.8 oz (64.3 kg)  07/16/21 141 lb 3.2 oz (64 kg)  04/17/21 143 lb (64.9 kg)   General: Well developed, well nourished female in no acute distress.   Head: Normocephalic, atraumatic.   Eyes:   Pupils equal and round. EOMI.   Sclera white.  No eye drainage.   Ears/Nose/Mouth/Throat: Nares patent, no nasal drainage.  Normal dentition, mucous membranes moist.   Neck: supple, no cervical lymphadenopathy, no thyromegaly Cardiovascular: regular rate, normal S1/S2, no murmurs Respiratory: No increased work of breathing.  Lungs clear to auscultation bilaterally.  No wheezes. Abdomen: soft, nontender, nondistended. No appreciable masses  Extremities: warm, well perfused, cap refill < 2 sec.   Musculoskeletal: Normal muscle mass.  Normal strength Skin: warm, dry.  No rash or lesions. Neurologic: alert and oriented, normal speech, no tremor   Labs:   Results for orders placed or performed in visit on 10/18/21  POCT Glucose (Device for Home Use)  Result Value Ref Range   Glucose Fasting, POC     POC Glucose  180 (A) 70 - 99 mg/dl  POCT glycosylated hemoglobin (Hb A1C)  Result Value Ref Range   Hemoglobin A1C 6.5 (A) 4.0 - 5.6 %   HbA1c POC (<> result, manual entry)     HbA1c, POC (prediabetic range)     HbA1c, POC (controlled diabetic range)       Assessment/Plan: Melanie Morrow is a 21 y.o. female with controlled type 1 diabetes on MDI and Dexcom CGM. Blood sugars are well managed with close assistance from her parents and grandparents. Her hemoglobin A1c is 6.5% today. TIR is 51% with minimal hypoglycemia.  1-4. Controlled diabetes mellitus type 1 without complications (HCC)/Developmental delay/Insulin titration - Reviewed meter and CGM download. Discussed trends and patterns.  - Rotate injection sites to prevent scar tissue.  - Reviewed carb counting and importance of accurate carb counting.  - Discussed signs and symptoms of hypoglycemia. Always have glucose available.  - POCT glucose and hemoglobin A1c  - Reviewed growth chart.  - Lantus 16 units  -  Novolog 150/50/10 plan add 1 unit to breakfast -  Novolog at lunch 150/50/15 plan while at school.  - School care plan discussed  and completed.  - Discussed safe travel with diabetes.   5. Elevated alkaline phosphatase  - Continue follow up with [facility] for evaluation and management.  - Continue vitmain D drops 4000 units daily  - Essentially, Melanie Morrow is a 20yo nonverbal, mobile female with T1DM who has had persistently elevated alkaline phosphatase (700-800 range) over the past several years, initially with vitamin D deficiency that has now been corrected. 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   Mom denies recent fractures or concerns of bone pain.  Discussed her case anonymously with my adult endocrinology colleague (Dr. Cruzita Lederer), who recommended referral to Dr. Edmonia James with [facility].   Influenza vaccine given. Counseling provided.   Follow-up:  3 months.   LOS: >40  spent today reviewing the medical chart, counseling the patient/family, and documenting today's visit.    When a patient is on insulin, intensive monitoring of blood glucose levels is necessary to avoid hyperglycemia and hypoglycemia. Severe hyperglycemia/hypoglycemia can lead to hospital admissions and be life threatening.      Hermenia Bers,  FNP-C  Pediatric Specialist  [address] [place]  [place], [address]  Tele: [phone]

## 2021-11-13 ENCOUNTER — Encounter (INDEPENDENT_AMBULATORY_CARE_PROVIDER_SITE_OTHER): Payer: Self-pay

## 2021-11-22 ENCOUNTER — Encounter (INDEPENDENT_AMBULATORY_CARE_PROVIDER_SITE_OTHER): Payer: Self-pay

## 2021-12-11 ENCOUNTER — Encounter (INDEPENDENT_AMBULATORY_CARE_PROVIDER_SITE_OTHER): Payer: Self-pay

## 2022-01-07 NOTE — Progress Notes (Signed)
Medical Nutrition Therapy - Initial Assessment Appt start time: 9:42 AM Appt end time: 10:13 AM Reason for referral: Type 1 Diabetes Referring provider: Hermenia Bers, NP - Endo Pertinent medical hx: Type 1 Diabetes (dx age: 21), Adjustment reaction to medical therapy, Developmental delya, Nonverbal, Monoallelic mutation of SATB2 gene  Assessment: Food allergies: none Pertinent Medications: see medication list - insulin Vitamins/Supplements: vitamin D (2000 IU)  Pertinent labs:  (11/6) POCT Glucose: 169 (high) (11/6) POCT Hgb A1c: 6.5 (high) (8/3) POCT Hgb A1c - 6.5 (high) (8/3) POCT Glucose - 180 (high) (5/1) Lipid Panel: Cholesterol - 237 (high), LDL Cholesterol - 159 (high) (5/1) Vitamin D, CMP, Thyroid Panel - WNL  (11/6) Anthropometrics: Ht: 154 cm  Wt: 63.6 kg   BMI: 26.7   IBW based on [place] Equation: 45.4 kg (+/- 10%)  Estimated minimum caloric needs: 1750-1950 kcal/day (EER) Estimated minimum protein needs: 0.8 g/kg/day (DRI) Estimated minimum fluid needs: 1750-1950 mL/kg/day (1 mL/kcal)  Primary concerns today: Consult for carb counting education in setting of new onset type 1 diabetes. Mom accompanied pt to appt today.  Dietary Intake Hx: Usual eating pattern includes: 3 meals and 0-1 snacks per day.  Meal location: [employer]   Meal duration: 20-30 minutes   Everyone served same meals: typically   Family meals: yes  Fast-food/eating out: 1-2x/week  School breakfast/lunch: lunch (5x/week)   Food Insecurity: none  Methods of CHO counting used: spreadsheet with common foods, Calorie [employer], Manufacturer's website  What do you feel is your biggest struggle with CHO counting: no concern   24-hr recall: Breakfast: 3 mini pancakes w/ syrup OR 2 toaster strudels OR 2 eggs + a few pieces bacon + 1 cup 2% milk Lunch: chicken filet sandwich + fruit + vegetable + water @ school  Dinner: standard plate of protein + starch + vegetable + 1 cup milk Snack: 1 piece  of pie   Typical Snacks: fruit gummies, popcorn  Typical Beverages: water, 2% or whole milk (1-2 cups/day)   Physical Activity: walks and PE class at school, horse riding 1x/week  GI: no concern  GU: no concern   Estimated intake likely exceeding given overweight status.  Pt consuming various food groups.  Pt consuming inadequate amounts of fruits and vegetables.  Nutrition Diagnosis: (11/6) Intake of saturated fats inconsistent with needs related to food- and nutrition-related knowledge deficit concerning type of fat as evidenced by high total and LDL cholesterol.  Intervention: Discussed importance of nutrient dense carbohydrates. Discussed all food groups, sources of each and their importance; carbohydrates vs noncarbohydrates and benefit of pairing to aid in blood glucose stabilization. Discussed importance of fiber to aid in blood sugar stabilization as well as working to lower cholesterol. Discussed sources of fiber in detail and benefit of switching to low-fat dairy. Discussed recommendations below. All questions answered, family in agreement with plan.   Nutrition Recommendations: - Incorporate a fruit or vegetable with each meal. Try having a fruit cup with breakfast. You can choose frozen, fresh, canned, dried. If you choose canned give them a rinse for the extra salt and sugar.  - Work on going to a low-fat dairy if possible. Remember this includes cheeses, yogurt and cottage cheese. It typically "reduced fat" or "made with part-skim milk".  - Anytime you're having a snack (that's not a low-carb snack), try pairing a carbohydrate + noncarbohydrate (protein/fat)   Cheese + crackers   Peanut butter + crackers   Peanut butter OR nuts + fruit  Cheese stick + fruit   Hummus + pretzels   Greek yogurt + granola  Keep up the good work and keep practicing! Follow-up with me as you would like.  Handouts Given: - Diabetes Foods Plate  - Hand Portion Sizes - GG Snack  Pairing  Teach back method used.  Monitoring/Evaluation: Continue to Monitor: - Growth trends - Lab values  Follow-up as needed.  Total time spent in counseling: 31 minutes.

## 2022-01-08 ENCOUNTER — Other Ambulatory Visit (INDEPENDENT_AMBULATORY_CARE_PROVIDER_SITE_OTHER): Payer: Self-pay | Admitting: Family

## 2022-01-08 DIAGNOSIS — E1065 Type 1 diabetes mellitus with hyperglycemia: Secondary | ICD-10-CM

## 2022-01-15 ENCOUNTER — Encounter (INDEPENDENT_AMBULATORY_CARE_PROVIDER_SITE_OTHER): Payer: Self-pay

## 2022-01-16 ENCOUNTER — Other Ambulatory Visit (INDEPENDENT_AMBULATORY_CARE_PROVIDER_SITE_OTHER): Payer: Self-pay

## 2022-01-16 MED ORDER — DEXCOM G7 RECEIVER DEVI: 1 refills | Status: DC

## 2022-01-16 MED ORDER — DEXCOM G7 SENSOR MISC: 4 refills | Status: DC

## 2022-01-21 ENCOUNTER — Ambulatory Visit (INDEPENDENT_AMBULATORY_CARE_PROVIDER_SITE_OTHER): Payer: Medicaid Other | Admitting: Dietician

## 2022-01-21 ENCOUNTER — Encounter (INDEPENDENT_AMBULATORY_CARE_PROVIDER_SITE_OTHER): Payer: Self-pay | Admitting: Family

## 2022-01-21 ENCOUNTER — Ambulatory Visit (INDEPENDENT_AMBULATORY_CARE_PROVIDER_SITE_OTHER): Payer: Medicaid Other | Admitting: Family

## 2022-01-21 VITALS — Wt 140.0 lb

## 2022-01-21 VITALS — BP 110/74 | HR 95 | Wt 140.0 lb

## 2022-01-21 DIAGNOSIS — Z79899 Other long term (current) drug therapy: Secondary | ICD-10-CM

## 2022-01-21 DIAGNOSIS — E782 Mixed hyperlipidemia: Secondary | ICD-10-CM

## 2022-01-21 DIAGNOSIS — E7800 Pure hypercholesterolemia, unspecified: Secondary | ICD-10-CM

## 2022-01-21 DIAGNOSIS — R748 Abnormal levels of other serum enzymes: Secondary | ICD-10-CM

## 2022-01-21 DIAGNOSIS — E109 Type 1 diabetes mellitus without complications: Secondary | ICD-10-CM

## 2022-01-21 DIAGNOSIS — R6250 Unspecified lack of expected normal physiological development in childhood: Secondary | ICD-10-CM

## 2022-01-21 DIAGNOSIS — E1065 Type 1 diabetes mellitus with hyperglycemia: Secondary | ICD-10-CM

## 2022-01-21 LAB — HEPATIC FUNCTION PANEL
AG Ratio: 1.6 (calc) (ref 1.0–2.5)
ALT: 11 U/L (ref 6–29)
AST: 12 U/L (ref 10–30)
Albumin: 4 g/dL (ref 3.6–5.1)
Alkaline phosphatase (APISO): 732 U/L — ABNORMAL HIGH (ref 31–125)
Bilirubin, Direct: 0.1 mg/dL (ref 0.0–0.2)
Globulin: 2.5 g/dL (calc) (ref 1.9–3.7)
Indirect Bilirubin: 0.5 mg/dL (calc) (ref 0.2–1.2)
Total Bilirubin: 0.6 mg/dL (ref 0.2–1.2)
Total Protein: 6.5 g/dL (ref 6.1–8.1)

## 2022-01-21 LAB — POCT GLUCOSE (DEVICE FOR HOME USE): Glucose Fasting, POC: 169 mg/dL — AB (ref 70–99)

## 2022-01-21 LAB — LIPID PANEL
Cholesterol: 233 mg/dL — ABNORMAL HIGH (ref <200)
HDL: 49 mg/dL — ABNORMAL LOW (ref >=50)
LDL Cholesterol (Calc): 162 mg/dL (calc) — ABNORMAL HIGH
Non-HDL Cholesterol (Calc): 184 mg/dL (calc) — ABNORMAL HIGH (ref <130)
Total CHOL/HDL Ratio: 4.8 (calc) (ref <5.0)
Triglycerides: 103 mg/dL (ref <150)

## 2022-01-21 LAB — POCT GLYCOSYLATED HEMOGLOBIN (HGB A1C): Hemoglobin A1C: 6.5 % — AB (ref 4.0–5.6)

## 2022-01-21 NOTE — Patient Instructions (Addendum)
It was a pleasure seeing you in clinic today. Please do not hesitate to contact me if you have questions or concerns.   Please sign up for MyChart. This is a communication tool that allows you to send an email directly to me. This can be used for questions, prescriptions and blood sugar reports. We will also release labs to you with instructions on MyChart. Please do not use MyChart if you need immediate or emergency assistance. Ask our wonderful front office staff if you need assistance.   - Check lipid panel today  - Meet with grace, RD.  - Continue 16 units of Lantus  - Novolog per paln  - Vitamin D3 daily
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
## 2022-01-21 NOTE — Patient Instructions (Signed)
Nutrition Recommendations: - Incorporate a fruit or vegetable with each meal. Try having a fruit cup with breakfast. You can choose frozen, fresh, canned, dried. If you choose canned give them a rinse for the extra salt and sugar.  - Work on going to a low-fat dairy if possible. Remember this includes cheeses, yogurt and cottage cheese. It typically "reduced fat" or "made with part-skim milk".  - Anytime you're having a snack (that's not a low-carb snack), try pairing a carbohydrate + noncarbohydrate (protein/fat)   Cheese + crackers   Peanut butter + crackers   Peanut butter OR nuts + fruit   Cheese stick + fruit   Hummus + pretzels   Greek yogurt + granola  Keep up the good work and keep practicing! Follow-up with me as you would like.

## 2022-01-23 ENCOUNTER — Encounter (INDEPENDENT_AMBULATORY_CARE_PROVIDER_SITE_OTHER): Payer: Self-pay

## 2022-01-25 ENCOUNTER — Other Ambulatory Visit (INDEPENDENT_AMBULATORY_CARE_PROVIDER_SITE_OTHER): Payer: Self-pay | Admitting: Family

## 2022-01-25 MED ORDER — ROSUVASTATIN CALCIUM 5 MG PO TABS: 5.0000 mg | ORAL_TABLET | Freq: Every day | ORAL | 3 refills | Status: DC

## 2022-03-04 ENCOUNTER — Encounter (INDEPENDENT_AMBULATORY_CARE_PROVIDER_SITE_OTHER): Payer: Self-pay

## 2022-03-04 ENCOUNTER — Other Ambulatory Visit (INDEPENDENT_AMBULATORY_CARE_PROVIDER_SITE_OTHER): Payer: Self-pay

## 2022-03-04 MED ORDER — NOVOLOG FLEXPEN 100 UNIT/ML ~~LOC~~ SOPN: PEN_INJECTOR | SUBCUTANEOUS | 1 refills | Status: DC

## 2022-03-22 ENCOUNTER — Encounter (INDEPENDENT_AMBULATORY_CARE_PROVIDER_SITE_OTHER): Payer: Self-pay

## 2022-03-22 ENCOUNTER — Other Ambulatory Visit (INDEPENDENT_AMBULATORY_CARE_PROVIDER_SITE_OTHER): Payer: Self-pay

## 2022-03-22 MED ORDER — ACCU-CHEK GUIDE ME W/DEVICE KIT: PACK | 1 refills | Status: AC

## 2022-04-25 ENCOUNTER — Other Ambulatory Visit (INDEPENDENT_AMBULATORY_CARE_PROVIDER_SITE_OTHER): Payer: Self-pay | Admitting: Family

## 2022-04-25 ENCOUNTER — Ambulatory Visit (INDEPENDENT_AMBULATORY_CARE_PROVIDER_SITE_OTHER): Payer: Medicaid Other | Admitting: Family

## 2022-04-25 ENCOUNTER — Encounter (INDEPENDENT_AMBULATORY_CARE_PROVIDER_SITE_OTHER): Payer: Self-pay | Admitting: Family

## 2022-04-25 VITALS — BP 110/68 | HR 86 | Wt 137.6 lb

## 2022-04-25 DIAGNOSIS — Z79899 Other long term (current) drug therapy: Secondary | ICD-10-CM

## 2022-04-25 DIAGNOSIS — E109 Type 1 diabetes mellitus without complications: Secondary | ICD-10-CM | POA: Diagnosis not present

## 2022-04-25 DIAGNOSIS — R6250 Unspecified lack of expected normal physiological development in childhood: Secondary | ICD-10-CM

## 2022-04-25 DIAGNOSIS — E785 Hyperlipidemia, unspecified: Secondary | ICD-10-CM | POA: Diagnosis not present

## 2022-04-25 DIAGNOSIS — E559 Vitamin D deficiency, unspecified: Secondary | ICD-10-CM

## 2022-04-25 DIAGNOSIS — E782 Mixed hyperlipidemia: Secondary | ICD-10-CM

## 2022-04-25 LAB — POCT GLUCOSE (DEVICE FOR HOME USE): POC Glucose: 136 mg/dl — AB (ref 70–99)

## 2022-04-25 LAB — POCT GLYCOSYLATED HEMOGLOBIN (HGB A1C): Hemoglobin A1C: 7.1 % — AB (ref 4.0–5.6)

## 2022-04-25 NOTE — Progress Notes (Signed)
Pediatric Endocrinology Diabetes Consultation Follow-up Visit  Melanie Morrow Feb 28, 2001 [id number]  Chief Complaint: Follow-up type 1 diabetes   Barrie Lyme   HPI: Melanie Morrow  is a 22 y.o. female presenting for follow-up of type 1 diabetes. she is accompanied to this visit by her mother.  54. [name] is a 71 y.o. with history of pervasive developmental disorder (also non-verbal) who was diagnosed with T1DM on 08/22/16 after presenting to her PCP with polyuria, polydipsia, weight loss and hyperglycemia.  At PCP's office she had BG of 238 with UA showing 2+ glucose and 2+ ketones.  She was admitted to [facility] pediatric floor where she was not in DKA.  She was started on an MDI regimen.  TFTs on admission were normal with negative ICA Ab and negative insulin Ab; GAD Ab were positive.  C-peptide was low at 1.9.  A1c was 10%.  2. Since last visit on 01/2022 she has been well.    She is wearing Dexcom G7 which mom reports that it is going "ok" but feels like she has had to calibrate it more recently. Melanie Morrow eats a normal diet, mom reports they have cut back on some junk food. Gets novolog after eating. Mom estimates 80-100 grams of carbs per meal on average. Hypoglycemia has been rare, she is unable to feel/communicate low blood sugars. Mom reports she mainly has highs when she eats higher carbs at meals.   She is taking Rosuvastatin 5 mg once daily. No obvious joint or muscle pain.   Takes 4000 units of Vitamin D3 daily. Will follow up with [name] if needed.  Insulin regimen: Lantus 16 units  . Novolog 150/50/10 half unit plan. Uses 150/50/15 at lunch.  Hypoglycemia: Few lows since last visit, no glucagon needed. CGM Download: Using dexcom   Med-alert ID: Wearing a new bracelet today Injection sites: arms, legs and abdomen. Rotating.  Uses arms for CGM  Annual labs due: 07/2022 Ophthalmology due: Had exam on 03/2022, due in 1 year.    ROS:  All systems reviewed with pertinent  positives listed below; otherwise negative. Constitutional:Sleeping well. 3 lbs weight loss Eyes; no vision changes. No blurry vision.  HENT: No difficulty swallowing. No neck pain  Respiratory: No increased work of breathing currently GI: No constipation or diarrhea GU: Occasional spotting. Has IUD.  Musculoskeletal: No joint deformity Neuro: Normal affect. No tremors or headaches.  Endocrine: As above    Past Medical History:   Past Medical History:  Diagnosis Date   Cognitive impairment    Development delay    Has been evaluated by genetics and has normal chromosomes   Dyspraxia    Non-verbal learning disorder    Type 1 diabetes mellitus ([place]) 08/22/2016   +GAD Ab, neg insulin Ab and ICA Ab    Medications:  Outpatient Encounter Medications as of 04/25/2022  Medication Sig   Alcohol Swabs (ALCOHOL PADS) 70 % PADS 6 wipes per day   BD PEN NEEDLE NANO U/F 32G X 4 MM MISC USE TO INJECT INSULIN 6X DAILY   Blood Glucose Monitoring Suppl (ACCU-CHEK GUIDE ME) w/Device KIT Use to check blood sugar up to 6 times a day   Continuous Blood Gluc Receiver (DEXCOM G7 RECEIVER) DEVI Use with dexcom G7 sensor/transmitter   Continuous Blood Gluc Sensor (DEXCOM G7 SENSOR) MISC Change every 10 days   diazePAM 5 MG/5ML SOLN Take by mouth.   Glucagon, rDNA, (GLUCAGON EMERGENCY) 1 MG KIT FOLLOW PACKAGE DIRECTIONS FOR LOW BLOOD SUGAR.  LANTUS SOLOSTAR 100 UNIT/ML Solostar Pen INJECT UP TO 50 UNITS SUBCUTANEOUSLY DAILY   levonorgestrel (MIRENA) 20 MCG/DAY IUD by Intrauterine route.   Norethin Ace-Eth Estrad-FE 1-20 MG-MCG(24) CHEW Chew by mouth.   NOVOLOG FLEXPEN 100 UNIT/ML FlexPen Use up to 50 units daily.   [DISCONTINUED] rosuvastatin (CRESTOR) 5 MG tablet Take 1 tablet (5 mg total) by mouth daily.   acetaminophen (TYLENOL CHILDRENS) 160 MG/5ML suspension Take 20.3 mLs (650 mg total) by mouth every 6 (six) hours as needed for moderate pain or fever. (Patient not taking: Reported on 10/26/2019)    Continuous Blood Gluc Sensor (DEXCOM G6 SENSOR) MISC Change sensor every 10 days (Patient not taking: Reported on 01/21/2022)   Continuous Blood Gluc Transmit (DEXCOM G6 TRANSMITTER) MISC Use with Dexcom sensors (Patient not taking: Reported on 01/21/2022)   ibuprofen (ADVIL) 100 MG/5ML suspension Take 20 mLs (400 mg total) by mouth every 6 (six) hours as needed for fever. (Patient not taking: Reported on 10/26/2019)   No facility-administered encounter medications on file as of 04/25/2022.    Allergies: Allergies  Allergen Reactions   Penicillins Hives   Amoxicillin Hives and Rash   Cefdinir Rash    Surgical History: Past Surgical History:  Procedure Laterality Date   DILATION AND CURETTAGE, DIAGNOSTIC / THERAPEUTIC     INTRAUTERINE DEVICE (IUD) INSERTION     MULTIPLE TOOTH EXTRACTIONS Bilateral     Family History:  Family History  Problem Relation Age of Onset   Diabetes Mother    Hyperlipidemia Mother    Diabetes Maternal Uncle    Diabetes Maternal Grandfather    Dementia Maternal Grandfather    Hyperlipidemia Maternal Grandfather    Hypertension Maternal Grandfather    Hypertension Paternal Grandmother    No family history of T1DM or hypothyroidism   Social History: Lives with: parents, grandparents also involved in her care   Physical Exam:  Vitals:   04/25/22 0827  BP: 110/68  Pulse: 86  Weight: 137 lb 9.6 oz (62.4 kg)       BP 110/68   Pulse 86   Wt 137 lb 9.6 oz (62.4 kg)   BMI 26.28 kg/m  Body mass index: body mass index is 26.28 kg/m. Growth %ile SmartLinks can only be used for patients less than 50 years old.  Ht Readings from Last 3 Encounters:  10/26/19 5' 0.67" (1.541 m) (8 %, Z= -1.42)*  10/07/19 5' 0.5" (1.537 m) (7 %, Z= -1.49)*  07/22/19 5' 0.63" (1.54 m) (8 %, Z= -1.43)*   * Growth percentiles are based on CDC (Girls, 2-20 Years) data.   Wt Readings from Last 3 Encounters:  04/25/22 137 lb 9.6 oz (62.4 kg)  01/21/22 140 lb (63.5 kg)   01/21/22 140 lb (63.5 kg)   General: Well developed, well nourished female in no acute distress.  Head: Normocephalic, atraumatic.   Eyes:  Pupils equal and round. EOMI.   Sclera white.  No eye drainage.   Ears/Nose/Mouth/Throat: Nares patent, no nasal drainage.  Normal dentition, mucous membranes moist.   Neck: supple, no cervical lymphadenopathy, no thyromegaly Cardiovascular: regular rate, normal S1/S2, no murmurs Respiratory: No increased work of breathing.  Lungs clear to auscultation bilaterally.  No wheezes. Abdomen: soft, nontender, nondistended. No appreciable masses  Extremities: warm, well perfused, cap refill < 2 sec.   Musculoskeletal: Normal muscle mass.  Normal strength Skin: warm, dry.  No rash or lesions. Neurologic: alert and oriented, normal speech, no tremor   Labs:   Results for  orders placed or performed in visit on 04/25/22  POCT glycosylated hemoglobin (Hb A1C)  Result Value Ref Range   Hemoglobin A1C 7.1 (A) 4.0 - 5.6 %   HbA1c POC (<> result, manual entry)     HbA1c, POC (prediabetic range)     HbA1c, POC (controlled diabetic range)    POCT Glucose (Device for Home Use)  Result Value Ref Range   Glucose Fasting, POC     POC Glucose 136 (A) 70 - 99 mg/dl     Assessment/Plan: Caylee is a 22 y.o. female with type 1 diabetes on MDI and Dexcom in good control. Has a pattern of hyperglycemia after lunch, however, since she is at school it is safer to have mild hyperglycemia then frequent hypoglycemia (as she did previously with stronger carb ratio) . Hemoglobin A1c is 7.1% today. She is doing well with rosuvastatin therapy.   1-3. Controlled diabetes mellitus type 1 without complications (HCC)/Developmental delay/Insulin titration.  - Lantus 16 units  -  Novolog 150/50/10 plan add 1 unit to breakfast -  Novolog at lunch 150/50/15 plan while at school.  - Reviewed CGM download. Discussed trends and patterns.  - Rotate injection sites to prevent scar  tissue.  - Reviewed carb counting and importance of accurate carb counting.  - Discussed signs and symptoms of hypoglycemia. Always have glucose available.  - POCT glucose and hemoglobin A1c  - Reviewed growth chart.    4. Elevated alkaline phosphatase  - 4000 units of Vitamin D 3 daily.  - She has been evaluated by [name] who recommend routine follow up with labs. She has also been evaluated by genetics.  - 25 OH vit D ordered.   5. Hyperlipidemia  - 5 mg of Rosuvastatin once daily. Discussed possible side effects.  - Fasting lipid panel ordered     Follow-up:  3 months.   LOS:>40  spent today reviewing the medical chart, counseling the patient/family, and documenting today's visit.  When a patient is on insulin, intensive monitoring of blood glucose levels is necessary to avoid hyperglycemia and hypoglycemia. Severe hyperglycemia/hypoglycemia can lead to hospital admissions and be life threatening.      Hermenia Bers,  FNP-C  Pediatric Specialist  [address] [place]  [place], [address]  Tele: [phone]

## 2022-04-25 NOTE — Patient Instructions (Signed)
It was a pleasure seeing you in clinic today. Please do not hesitate to contact me if you have questions or concerns.   Please sign up for MyChart. This is a communication tool that allows you to send an email directly to me. This can be used for questions, prescriptions and blood sugar reports. We will also release labs to you with instructions on MyChart. Please do not use MyChart if you need immediate or emergency assistance. Ask our wonderful front office staff if you need assistance.   . Continue insulin as planned.  - Rosuvastatin 5 mg daily  - Vitamin D daily   - labs today

## 2022-04-26 LAB — LIPID PANEL
Cholesterol: 168 mg/dL (ref <200)
HDL: 50 mg/dL (ref >=50)
LDL Cholesterol (Calc): 95 mg/dL (calc)
Non-HDL Cholesterol (Calc): 118 mg/dL (calc) (ref <130)
Total CHOL/HDL Ratio: 3.4 (calc) (ref <5.0)
Triglycerides: 135 mg/dL (ref <150)

## 2022-04-26 LAB — VITAMIN D 25 HYDROXY (VIT D DEFICIENCY, FRACTURES): Vit D, 25-Hydroxy: 60 ng/mL (ref 30–100)

## 2022-06-24 ENCOUNTER — Encounter (INDEPENDENT_AMBULATORY_CARE_PROVIDER_SITE_OTHER): Payer: Self-pay

## 2022-06-24 ENCOUNTER — Other Ambulatory Visit (INDEPENDENT_AMBULATORY_CARE_PROVIDER_SITE_OTHER): Payer: Self-pay

## 2022-06-24 MED ORDER — DEXCOM G7 SENSOR MISC: 4 refills | Status: DC

## 2022-07-23 ENCOUNTER — Encounter (INDEPENDENT_AMBULATORY_CARE_PROVIDER_SITE_OTHER): Payer: Self-pay

## 2022-07-26 ENCOUNTER — Encounter (INDEPENDENT_AMBULATORY_CARE_PROVIDER_SITE_OTHER): Payer: Self-pay

## 2022-07-29 ENCOUNTER — Encounter (INDEPENDENT_AMBULATORY_CARE_PROVIDER_SITE_OTHER): Payer: Self-pay | Admitting: Family

## 2022-07-29 ENCOUNTER — Ambulatory Visit (INDEPENDENT_AMBULATORY_CARE_PROVIDER_SITE_OTHER): Payer: Medicaid Other | Admitting: Family

## 2022-07-29 VITALS — BP 110/76 | HR 80 | Wt 133.6 lb

## 2022-07-29 DIAGNOSIS — E782 Mixed hyperlipidemia: Secondary | ICD-10-CM | POA: Diagnosis not present

## 2022-07-29 DIAGNOSIS — E559 Vitamin D deficiency, unspecified: Secondary | ICD-10-CM | POA: Diagnosis not present

## 2022-07-29 DIAGNOSIS — R748 Abnormal levels of other serum enzymes: Secondary | ICD-10-CM

## 2022-07-29 DIAGNOSIS — R6250 Unspecified lack of expected normal physiological development in childhood: Secondary | ICD-10-CM | POA: Diagnosis not present

## 2022-07-29 DIAGNOSIS — E109 Type 1 diabetes mellitus without complications: Secondary | ICD-10-CM | POA: Diagnosis not present

## 2022-07-29 DIAGNOSIS — Z79899 Other long term (current) drug therapy: Secondary | ICD-10-CM

## 2022-07-29 LAB — POCT GLYCOSYLATED HEMOGLOBIN (HGB A1C): Hemoglobin A1C: 6.4 % — AB (ref 4.0–5.6)

## 2022-07-29 LAB — POCT GLUCOSE (DEVICE FOR HOME USE): Glucose Fasting, POC: 174 mg/dL — AB (ref 70–99)

## 2022-07-29 MED ORDER — ROSUVASTATIN CALCIUM 5 MG PO TABS: 5.0000 mg | ORAL_TABLET | Freq: Every day | ORAL | 1 refills | Status: DC

## 2022-07-29 NOTE — Progress Notes (Signed)
Pediatric Specialists [facility] [address], [address], [place], [place] [address] Phone: [phone] Fax: [phone]                                          Diabetes Medical Management Plan                                               School Year [phone] - 2024 *This diabetes plan serves as a healthcare provider order, transcribe onto school form.   The nurse will teach school staff procedures as needed for diabetic care in the school.Melanie Morrow   DOB: 09/17/2000   School: _______________________________________________________________  Parent/Guardian: ___________________________phone #: _____________________  Parent/Guardian: ___________________________phone #: _____________________  Diabetes Diagnosis: Type 1 Diabetes ______________________________________________________________________  Blood Glucose Monitoring  Target range for blood glucose is: 100-200 mg/dL Times to check blood glucose level: Before meals, Before Physical Education, After Physical Education, Before Recess, After Recess, As needed for signs/symptoms, and Before dismissal of school Student has a CGM (Continuous Glucose Monitor): Yes-Dexcom Student may use blood sugar reading from continuous glucose monitor to determine insulin dose.   CGM Alarms. If CGM alarm goes off and student is unsure of how to respond to alarm, student should be escorted to school nurse/school diabetes team member. If CGM is not working or if student is not wearing it, check blood sugar via fingerstick. If CGM is dislodged, do NOT throw it away, and return it to parent/guardian. CGM site may be reinforced with medical tape. If glucose remains low on CGM 15 minutes after hypoglycemia treatment, check glucose with fingerstick and glucometer. It appears most diabetes technology has not been studied with use of Evolv Express body scanners. These OpenGate/EvolvExpress body scanners seem to be most similar to body  scanners at the airport.  Most diabetes technology recommends against wearing a continuous glucose monitor or insulin pump in a body scanner or x-ray machine, therefore, [facility] pediatric specialist endocrinology providers do not recommend wearing a continuous glucose monitor or insulin pump through an Evolv Express body scanner. Hand-wanding, pat-downs, visual inspection, and walk-through metal detectors are OK to use.  Student's Self Care for Glucose Monitoring: dependent (needs supervision AND assistance) Self treats mild hypoglycemia: No  It is preferable to treat hypoglycemia in the classroom so student does not miss instructional time.  If the student is not in the classroom (ie at recess or specials, etc) and does not have fast sugar with them, then they should be escorted to the school nurse/school diabetes team member. If the student has a CGM and uses a cell phone as the reader device, the cell phone should be with them at all times.    Hypoglycemia (Low Blood Sugar) Hyperglycemia (High Blood Sugar)   Shaky                           Dizzy Sweaty                         Weakness/Fatigue Pale  Headache Fast Heart Beat            Blurry vision Hungry                         Slurred Speech Irritable/Anxious           Seizure  Complaining of feeling low or CGM alarms low  Frequent urination          Abdominal Pain Increased Thirst              Headaches           Nausea/Vomiting            Fruity Breath Sleepy/Confused            Chest Pain Inability to Concentrate Irritable Blurred Vision   Check glucose if signs/symptoms above Stay with child at all times Give 15 grams of carbohydrate (fast sugar) if blood sugar is less than 80 mg/dL, and child is conscious, cooperative, and able to swallow.  3-4 glucose tabs Half cup (4 oz) of juice or regular soda Check blood sugar in 15 minutes. If blood sugar does not improve, give fast sugar again If still no  improvement after 2 fast sugars, call parent/guardian. Call 911, parent/guardian and/or child's health care provider if Child's symptoms do not go away Child loses consciousness Unable to reach parent/guardian and symptoms worsen  If child is UNCONSCIOUS, experiencing a seizure or unable to swallow Place student on side  Administer glucagon (Baqsimi/Gvoke/Glucagon For Injection) depending on the dosage formulation prescribed to the patient.  Glucagon Formulation Dose  Baqsimi Regardless of weight: 3 mg intranasally   Gvoke Hypopen <45 kg/100 pounds: 0.5 mg/0.58mL subcutaneously > 45 kg/100 pounds: 1 mg/0.2 mL subcutaneously  Glucagon for injection <20 kg/45 lbs: 0.5 mg/0.5 mL subcutaneously >20 kg/45 lbs: 1 mg/1 mL subcutaneously  CALL 911, parent/guardian, and/or child's health care provider *Pump- Review pump therapy guidelines Check glucose if signs/symptoms above Check Ketones if above 300 mg/dL after 2 glucose checks if ketone strips are available. Notify Parent/Guardian if glucose is over 300 mg/dL and patient has ketones in urine. Encourage water/sugar free fluids, allow unlimited use of bathroom Administer insulin as below if it has been over 3 hours since last insulin dose Recheck glucose in 2.5-3 hours CALL 911 if child Loses consciousness Unable to reach parent/guardian and symptoms worsen       8.   If moderate to large ketones or no ketone strips available to check urine ketones, contact parent.  *Pump Check pump function Check pump site Check tubing Treat for hyperglycemia as above Refer to Pump Therapy Orders              Do not allow student to walk anywhere alone when blood sugar is low or suspected to be low.  Follow this protocol even if immediately prior to a meal.    Insulin Therapy  -This section is for those who are on insulin injections OR those on an insulin pump who are experiencing issues with the insulin pump (back up plan)    Adjustable Insulin, 2  Component Method:  See actual method below. Two Component Method (Multiple Daily Injections) Novolog  Food DOSE (Carbohydrate Coverage): Number of Carbs Units of Rapid Acting Insulin  0-14 0  15-29 1  30-44 2  45-59 3  60-74 4  75-89 5  90-104 6  105-119 7  120-134 8  135-149 9  150-164 10  165-179 11  180-194 12  195+  (# carbs divided by 15)     Correction DOSE: Glucose (mg/dL) Units of Rapid Acting Insulin  Less than 150 0  151-200 1  201-250 2  251-300 3  301-350 4  351-400 5  401-450 6  451-500 7  501-550 8  551 or more 9     When to give insulin Breakfast: Carbohydrate coverage plus correction dose per attached plan when glucose is above 150mg /dl and 3 hours since last insulin dose Lunch: Carbohydrate coverage plus correction dose per attached plan when glucose is above 150mg /dl and 3 hours since last insulin dose Snack: Carbohydrate coverage only per attached plan  If a student is not hungry and will not eat carbs, then you do not have to give food dose. You can give solely correction dose IF blood glucose is greater than >250 mg/dL AND no rapid acting insulin in the past three hours.  Student's Self Care Insulin Administration Skills: dependent (needs supervision AND assistance)  If there is a change in the daily schedule (field trip, delayed opening, early release or class party), please contact parents for instructions.  Parents/Guardians Authorization to Adjust Insulin Dose: Yes:  Parents/guardians are authorized to increase or decrease insulin doses plus or minus 3 units.    Physical Activity, Exercise and Sports  A quick acting source of carbohydrate such as glucose tabs or juice must be available at the site of physical education activities or sports. Elva Cuadrado is encouraged to participate in all exercise, sports and activities.  Do not withhold exercise for high blood glucose.  Tulia Baires may participate in sports, exercise if blood  glucose is above 100.  For blood glucose below 100 before exercise, give 15 grams carbohydrate snack without insulin.   Testing  ALL STUDENTS SHOULD HAVE A 504 PLAN or IHP (See 504/IHP for additional instructions). The student may need to step out of the testing environment to take care of personal health needs (example:  treating low blood sugar or taking insulin to correct high blood sugar).   The student should be allowed to return to complete the remaining test pages, without a time penalty.   The student must have access to glucose tablets/fast acting carbohydrates/juice at all times. The student will need to be within 20 feet of their CGM reader/phone, and insulin pump reader/phone.   SPECIAL INSTRUCTIONS:   I give permission to the school nurse, trained diabetes personnel, and other designated staff members of _________________________school to perform and carry out the diabetes care tasks as outlined by Janene Madeira Diabetes Medical Management Plan.  I also consent to the release of the information contained in this Diabetes Medical Management Plan to all staff members and other adults who have custodial care of Nasro Gillyard and who may need to know this information to maintain [employer] health and safety.       Physician Signature: Gretchen Short, NP               Date: 07/29/2022 Parent/Guardian Signature: _______________________  Date: ___________________

## 2022-07-29 NOTE — Progress Notes (Signed)
Pediatric Endocrinology Diabetes Consultation Follow-up Visit  Melanie Morrow 04-06-2000 [id number]  Chief Complaint: Follow-up type 1 diabetes   [name]   HPI: Melanie Morrow  is a 22 y.o. female presenting for follow-up of type 1 diabetes. she is accompanied to this visit by her mother.  1. Melanie Morrow is a 50 y.o. with history of pervasive developmental disorder (also non-verbal) who was diagnosed with T1DM on 08/22/16 after presenting to her PCP with polyuria, polydipsia, weight loss and hyperglycemia.  At PCP's office she had BG of 238 with UA showing 2+ glucose and 2+ ketones.  She was admitted to [facility] pediatric floor where she was not in DKA.  She was started on an MDI regimen.  TFTs on admission were normal with negative ICA Ab and negative insulin Ab; GAD Ab were positive.  C-peptide was low at 1.9.  A1c was 10%.  2. Since last visit on 04/2022 she has been well.    Mom is looking for a day program for Melanie Morrow now that she is aging out of school, this has been difficult. She will be going [employer] on Thursdays and will need a care plan. She will start in June.   Reports that blood sugars have been pretty stable. She had a cold over the weekend so blood sugars were slightly higher. Using Dexcom G7 which has been working well. She takes Novolog shots as soon as she is done eating and usually has around 80 grams of carbs per meal. Hypoglycemia has been rare.   She is taking Rosuvastatin 5 mg once daily. Appears to be tolerating well.   4000 units of vitamin D3 daily.   Insulin regimen: Lantus 16 units  . Novolog 150/50/10 half unit plan. Uses 150/50/15 at lunch.  Hypoglycemia: Few lows since last visit, no glucagon needed. CGM Download: Using dexcom   Med-alert ID: Wearing a new bracelet today Injection sites: arms, legs and abdomen. Rotating.  Uses arms for CGM  Annual labs due: 07/2022 Ophthalmology due: Had exam on 03/2022, due in 1 year.    ROS:  All systems  reviewed with pertinent positives listed below; otherwise negative. Constitutional:Sleeping well.  Eyes; no vision changes. No blurry vision.  HENT: No difficulty swallowing. No neck pain  Respiratory: No increased work of breathing currently GI: No constipation or diarrhea GU: Occasional spotting. Has IUD.  Musculoskeletal: No joint deformity Neuro: Normal affect. No tremors or headaches.  Endocrine: As above    Past Medical History:   Past Medical History:  Diagnosis Date   Cognitive impairment    Development delay    Has been evaluated by genetics and has normal chromosomes   Dyspraxia    Non-verbal learning disorder    Type 1 diabetes mellitus (HCC) 08/22/2016   +GAD Ab, neg insulin Ab and ICA Ab    Medications:  Outpatient Encounter Medications as of 07/29/2022  Medication Sig   Alcohol Swabs (ALCOHOL PADS) 70 % PADS 6 wipes per day   BD PEN NEEDLE NANO U/F 32G X 4 MM MISC USE TO INJECT INSULIN 6X DAILY   Blood Glucose Monitoring Suppl (ACCU-CHEK GUIDE ME) w/Device KIT Use to check blood sugar up to 6 times a day   Continuous Blood Gluc Receiver (DEXCOM G7 RECEIVER) DEVI Use with dexcom G7 sensor/transmitter   Continuous Blood Gluc Sensor (DEXCOM G7 SENSOR) MISC Change every 10 days   Glucagon, rDNA, (GLUCAGON EMERGENCY) 1 MG KIT FOLLOW PACKAGE DIRECTIONS FOR LOW BLOOD SUGAR.   levonorgestrel (MIRENA)  20 MCG/DAY IUD by Intrauterine route.   Norethin Ace-Eth Estrad-FE 1-20 MG-MCG(24) CHEW Chew by mouth.   NOVOLOG FLEXPEN 100 UNIT/ML FlexPen Use up to 50 units daily.   [DISCONTINUED] rosuvastatin (CRESTOR) 5 MG tablet TAKE 1 TABLET (5 MG TOTAL) BY MOUTH DAILY.   acetaminophen (TYLENOL CHILDRENS) 160 MG/5ML suspension Take 20.3 mLs (650 mg total) by mouth every 6 (six) hours as needed for moderate pain or fever. (Patient not taking: Reported on 10/26/2019)   Continuous Blood Gluc Sensor (DEXCOM G6 SENSOR) MISC Change sensor every 10 days (Patient not taking: Reported on  01/21/2022)   Continuous Blood Gluc Transmit (DEXCOM G6 TRANSMITTER) MISC Use with Dexcom sensors (Patient not taking: Reported on 01/21/2022)   diazePAM 5 MG/5ML SOLN Take by mouth.   ibuprofen (ADVIL) 100 MG/5ML suspension Take 20 mLs (400 mg total) by mouth every 6 (six) hours as needed for fever. (Patient not taking: Reported on 10/26/2019)   LANTUS SOLOSTAR 100 UNIT/ML Solostar Pen INJECT UP TO 50 UNITS SUBCUTANEOUSLY DAILY   rosuvastatin (CRESTOR) 5 MG tablet Take 1 tablet (5 mg total) by mouth daily.   No facility-administered encounter medications on file as of 07/29/2022.    Allergies: Allergies  Allergen Reactions   Penicillins Hives   Amoxicillin Hives and Rash   Cefdinir Rash    Surgical History: Past Surgical History:  Procedure Laterality Date   DILATION AND CURETTAGE, DIAGNOSTIC / THERAPEUTIC     INTRAUTERINE DEVICE (IUD) INSERTION     MULTIPLE TOOTH EXTRACTIONS Bilateral     Family History:  Family History  Problem Relation Age of Onset   Diabetes Mother    Hyperlipidemia Mother    Diabetes Maternal Uncle    Diabetes Maternal Grandfather    Dementia Maternal Grandfather    Hyperlipidemia Maternal Grandfather    Hypertension Maternal Grandfather    Hypertension Paternal Grandmother    No family history of T1DM or hypothyroidism   Social History: Lives with: parents, grandparents also involved in her care   Physical Exam:  Vitals:   07/29/22 0831  BP: 110/76  Pulse: 80  Weight: 133 lb 9.6 oz (60.6 kg)     BP 110/76   Pulse 80   Wt 133 lb 9.6 oz (60.6 kg)   BMI 25.52 kg/m  Body mass index: body mass index is 25.52 kg/m. Growth %ile SmartLinks can only be used for patients less than 62 years old.  Ht Readings from Last 3 Encounters:  10/26/19 5' 0.67" (1.541 m) (8 %, Z= -1.42)*  10/07/19 5' 0.5" (1.537 m) (7 %, Z= -1.49)*  07/22/19 5' 0.63" (1.54 m) (8 %, Z= -1.43)*   * Growth percentiles are based on CDC (Girls, 2-20 Years) data.   Wt  Readings from Last 3 Encounters:  07/29/22 133 lb 9.6 oz (60.6 kg)  04/25/22 137 lb 9.6 oz (62.4 kg)  01/21/22 140 lb (63.5 kg)   General: Well developed, well nourished female in no acute distress.   Head: Normocephalic, atraumatic.   Eyes:  Pupils equal and round. EOMI.   Sclera white.  No eye drainage.   Ears/Nose/Mouth/Throat: Nares patent, no nasal drainage.  Normal dentition, mucous membranes moist.   Neck: supple, no cervical lymphadenopathy, no thyromegaly Cardiovascular: regular rate, normal S1/S2, no murmurs Respiratory: No increased work of breathing.  Lungs clear to auscultation bilaterally.  No wheezes. Abdomen: soft, nontender, nondistended. No appreciable masses  Extremities: warm, well perfused, cap refill < 2 sec.   Musculoskeletal: Normal muscle mass.  Normal  strength Skin: warm, dry.  No rash or lesions. Neurologic: alert and oriented, +developmental delay , no tremor  Labs:   Results for orders placed or performed in visit on 07/29/22  POCT Glucose (Device for Home Use)  Result Value Ref Range   Glucose Fasting, POC 174 (A) 70 - 99 mg/dL   POC Glucose    POCT glycosylated hemoglobin (Hb A1C)  Result Value Ref Range   Hemoglobin A1C 6.4 (A) 4.0 - 5.6 %   HbA1c POC (<> result, manual entry)     HbA1c, POC (prediabetic range)     HbA1c, POC (controlled diabetic range)       Assessment/Plan: Melanie Morrow is a 22 y.o. female with type 1 diabetes on MDI and Dexcom in good control. Hemoglobin A1c is 6.4% with minimal hypoglycemia. She is doing well with MDI and Dexcom CGM therapy. Tolerating Rosuvastatin well.    1-3. Controlled diabetes mellitus type 1 without complications (HCC)/Developmental delay/Insulin titration.  - - Reviewed CGM download. Discussed trends and patterns.  - Rotate injection  sites to prevent scar tissue.  - bolus 15 minutes prior to eating to limit blood sugar spikes.  - Reviewed carb counting and importance of accurate carb counting.  -  Discussed signs and symptoms of hypoglycemia. Always have glucose available.  - POCT glucose and hemoglobin A1c  - Reviewed growth chart.  - Lantus 16 units  -  Novolog 150/50/10 plan add 1 unit to breakfast -  Novolog at lunch 150/50/15 plan while at school.  - School care plan for [employer] completed.   4. Elevated alkaline phosphatase /Hypovitamin D  - Continue 4000 units of Rosuvastatin daily  - She has been evaluated by [name] who recommend routine follow up with labs. She has also been evaluated by genetics.    5. Hyperlipidemia  - 5 mg of Rosuvastatin once daily. Discussed possible side effects.  - Fasting lipid panel at next visit.  - Low cholesterol diet.     Follow-up:  3 months.   LOS:>40  spent today reviewing the medical chart, counseling the patient/family, and documenting today's visit.  When a patient is on insulin, intensive monitoring of blood glucose levels is necessary to avoid hyperglycemia and hypoglycemia. Severe hyperglycemia/hypoglycemia can lead to hospital admissions and be life threatening.      Gretchen Short,  FNP-C  Pediatric Specialist  [address]  [place], [address]  Tele: [phone]

## 2022-07-29 NOTE — Patient Instructions (Signed)
It was a pleasure seeing you in clinic today. Please do not hesitate to contact me if you have questions or concerns.   Please sign up for MyChart. This is a communication tool that allows you to send an email directly to me. This can be used for questions, prescriptions and blood sugar reports. We will also release labs to you with instructions on MyChart. Please do not use MyChart if you need immediate or emergency assistance. Ask our wonderful front office staff if you need assistance.   - School care plan completed  - Labs at next visit  - Continue rosuvastatin 5 mg daily  - A1c is 6.4%

## 2022-10-30 ENCOUNTER — Encounter (INDEPENDENT_AMBULATORY_CARE_PROVIDER_SITE_OTHER): Payer: Self-pay | Admitting: Family

## 2022-10-30 ENCOUNTER — Ambulatory Visit (INDEPENDENT_AMBULATORY_CARE_PROVIDER_SITE_OTHER): Payer: Medicaid Other | Admitting: Family

## 2022-10-30 VITALS — BP 112/80 | HR 76 | Wt 136.7 lb

## 2022-10-30 DIAGNOSIS — R6250 Unspecified lack of expected normal physiological development in childhood: Secondary | ICD-10-CM | POA: Diagnosis not present

## 2022-10-30 DIAGNOSIS — E559 Vitamin D deficiency, unspecified: Secondary | ICD-10-CM

## 2022-10-30 DIAGNOSIS — R748 Abnormal levels of other serum enzymes: Secondary | ICD-10-CM

## 2022-10-30 DIAGNOSIS — Z1589 Genetic susceptibility to other disease: Secondary | ICD-10-CM

## 2022-10-30 DIAGNOSIS — E109 Type 1 diabetes mellitus without complications: Secondary | ICD-10-CM

## 2022-10-30 DIAGNOSIS — E785 Hyperlipidemia, unspecified: Secondary | ICD-10-CM | POA: Diagnosis not present

## 2022-10-30 DIAGNOSIS — E782 Mixed hyperlipidemia: Secondary | ICD-10-CM

## 2022-10-30 LAB — POCT GLUCOSE (DEVICE FOR HOME USE): POC Glucose: 184 mg/dl — AB (ref 70–99)

## 2022-10-30 LAB — POCT GLYCOSYLATED HEMOGLOBIN (HGB A1C): Hemoglobin A1C: 6.5 % — AB (ref 4.0–5.6)

## 2022-10-30 NOTE — Progress Notes (Signed)
Pediatric Endocrinology Diabetes Consultation Follow-up Visit  Melanie Morrow 2000-12-17 [id number]  Chief Complaint: Follow-up type 1 diabetes   Melanie Morrow   HPI: Melanie Morrow  is a 22 y.o. female presenting for follow-up of type 1 diabetes. she is accompanied to this visit by her mother.  1. Melanie Morrow is a 53 y.o. with history of pervasive developmental disorder (also non-verbal) who was diagnosed with T1DM on 08/22/16 after presenting to her PCP with polyuria, polydipsia, weight loss and hyperglycemia.  At PCP's office she had BG of 238 with UA showing 2+ glucose and 2+ ketones.  She was admitted to [facility] pediatric floor where she was not in DKA.  She was started on an MDI regimen.  TFTs on admission were normal with negative ICA Ab and negative insulin Ab; GAD Ab were positive.  C-peptide was low at 1.9.  A1c was 10%.  2. Since last visit on 07/2022 she has been well.    She was recently seen by cardiology, they requested that we order at Lipoprotein A level. She has not followed up with [name] for elevated Alk phos, mom reports they did not feel that follow up was needed.   Takes 5 mg of Rosuvastatin once daily. Tolerating well without obvious side effects.   She is going to a care program every Thursday which has been going well. She is spending the other four days with Grandmother. Mom feels like diabetes care and blood sugars have been pretty good overall. Hypoglycemia is very rare, if she does go low it is usually in the 70's-80's. She is using Dexcom G7 which is working well. Blood sugars run highest around lunch time.   She is taking Rosuvastatin 5 mg once daily. Appears to be tolerating well.   4000 units of vitamin D3 daily.   Concerns:  - Will be going to [place] in September for over a week on a cruise.   Insulin regimen: Lantus 16 units  . Novolog 150/50/10 half unit plan. Uses 150/50/15 at lunch.  Hypoglycemia: Few lows since last visit, no  glucagon needed. CGM Download: Using dexcom   Med-alert ID: Wearing a new bracelet today Injection sites: arms, legs and abdomen. Rotating.  Uses arms for CGM  Annual labs due: 07/2022 Ophthalmology due: Had exam on 03/2022, due in 1 year.    ROS:  All systems reviewed with pertinent positives listed below; otherwise negative. Constitutional:Sleeping well. Weight is stable.  Eyes; no vision changes. No blurry vision.  HENT: No difficulty swallowing. No neck pain  Respiratory: No increased work of breathing currently GI: No constipation or diarrhea GU: Occasional spotting. Has IUD.  Musculoskeletal: No joint deformity Neuro: Normal affect. No tremors or headaches.  Endocrine: As above    Past Medical History:   Past Medical History:  Diagnosis Date   Cognitive impairment    Development delay    Has been evaluated by genetics and has normal chromosomes   Dyspraxia    Non-verbal learning disorder    Type 1 diabetes mellitus (HCC) 08/22/2016   +GAD Ab, neg insulin Ab and ICA Ab    Medications:  Outpatient Encounter Medications as of 10/30/2022  Medication Sig   Alcohol Swabs (ALCOHOL PADS) 70 % PADS 6 wipes per day   BD PEN NEEDLE NANO U/F 32G X 4 MM MISC USE TO INJECT INSULIN 6X DAILY   Blood Glucose Monitoring Suppl (ACCU-CHEK GUIDE ME) w/Device KIT Use to check blood sugar up to 6 times a day  Continuous Blood Gluc Receiver (DEXCOM G7 RECEIVER) DEVI Use with dexcom G7 sensor/transmitter   Continuous Blood Gluc Sensor (DEXCOM G7 SENSOR) MISC Change every 10 days   diazePAM 5 MG/5ML SOLN Take by mouth.   Glucagon, rDNA, (GLUCAGON EMERGENCY) 1 MG KIT FOLLOW PACKAGE DIRECTIONS FOR LOW BLOOD SUGAR.   LANTUS SOLOSTAR 100 UNIT/ML Solostar Pen INJECT UP TO 50 UNITS SUBCUTANEOUSLY DAILY   levonorgestrel (MIRENA) 20 MCG/DAY IUD by Intrauterine route.   Norethin Ace-Eth Estrad-FE 1-20 MG-MCG(24) CHEW Chew by mouth.   NOVOLOG FLEXPEN 100 UNIT/ML FlexPen Use up to 50 units daily.    rosuvastatin (CRESTOR) 5 MG tablet Take 1 tablet (5 mg total) by mouth daily.   acetaminophen (TYLENOL CHILDRENS) 160 MG/5ML suspension Take 20.3 mLs (650 mg total) by mouth every 6 (six) hours as needed for moderate pain or fever. (Patient not taking: Reported on 10/26/2019)   ibuprofen (ADVIL) 100 MG/5ML suspension Take 20 mLs (400 mg total) by mouth every 6 (six) hours as needed for fever. (Patient not taking: Reported on 10/26/2019)   No facility-administered encounter medications on file as of 10/30/2022.    Allergies: Allergies  Allergen Reactions   Penicillins Hives   Amoxicillin Hives and Rash   Cefdinir Rash    Surgical History: Past Surgical History:  Procedure Laterality Date   DILATION AND CURETTAGE, DIAGNOSTIC / THERAPEUTIC     INTRAUTERINE DEVICE (IUD) INSERTION     MULTIPLE TOOTH EXTRACTIONS Bilateral     Family History:  Family History  Problem Relation Age of Onset   Diabetes Mother    Hyperlipidemia Mother    Diabetes Maternal Uncle    Diabetes Maternal Grandfather    Dementia Maternal Grandfather    Hyperlipidemia Maternal Grandfather    Hypertension Maternal Grandfather    Hypertension Paternal Grandmother    No family history of T1DM or hypothyroidism   Social History: Lives with: parents, grandparents also involved in her care   Physical Exam:  Vitals:   10/30/22 1520  BP: 112/80  Pulse: 76  Weight: 136 lb 11.2 oz (62 kg)      BP 112/80   Pulse 76   Wt 136 lb 11.2 oz (62 kg)   BMI 26.11 kg/m  Body mass index: body mass index is 26.11 kg/m. Growth %ile SmartLinks can only be used for patients less than 46 years old.  Ht Readings from Last 3 Encounters:  10/26/19 5' 0.67" (1.541 m) (8%, Z= -1.42)*  10/07/19 5' 0.5" (1.537 m) (7%, Z= -1.49)*  07/22/19 5' 0.63" (1.54 m) (8%, Z= -1.43)*   * Growth percentiles are based on CDC (Girls, 2-20 Years) data.   Wt Readings from Last 3 Encounters:  10/30/22 136 lb 11.2 oz (62 kg)  07/29/22 133  lb 9.6 oz (60.6 kg)  04/25/22 137 lb 9.6 oz (62.4 kg)   General: Well developed, well nourished female in no acute distress.   Head: Normocephalic, atraumatic.   Eyes:  Pupils equal and round. EOMI.   Sclera white.  No eye drainage.   Ears/Nose/Mouth/Throat: Nares patent, no nasal drainage.  Normal dentition, mucous membranes moist.   Neck: supple, no cervical lymphadenopathy, no thyromegaly Cardiovascular: regular rate, normal S1/S2, no murmurs Respiratory: No increased work of breathing.  Lungs clear to auscultation bilaterally.  No wheezes. Abdomen: soft, nontender, nondistended. No appreciable masses  Extremities: warm, well perfused, cap refill < 2 sec.   Musculoskeletal: Normal muscle mass.  Normal strength Skin: warm, dry.  No rash or lesions. Neurologic: alert  and oriented, , no tremor   Labs:   Results for orders placed or performed in visit on 10/30/22  POCT glycosylated hemoglobin (Hb A1C)  Result Value Ref Range   Hemoglobin A1C 6.5 (A) 4.0 - 5.6 %   HbA1c POC (<> result, manual entry)     HbA1c, POC (prediabetic range)     HbA1c, POC (controlled diabetic range)    POCT Glucose (Device for Home Use)  Result Value Ref Range   Glucose Fasting, POC     POC Glucose 184 (A) 70 - 99 mg/dl     Assessment/Plan: Ruya is a 22 y.o. female with type 1 diabetes on MDI and Dexcom in good control. Hemoglobin A1c is 6.5% today. Time in target range is 47% with limited hypoglycemia.     1-3. Controlled diabetes mellitus type 1 without complications (HCC)/Developmental delay/Insulin titration.  - Lantus 16 units  -  Novolog 150/50/10 plan add 1 unit to breakfast -  Novolog at lunch 150/50/15 plan while at school.  - Reviewed CGM download. Discussed trends and patterns.  - Rotate injection sites to prevent scar tissue.  - bolus 15 minutes prior to eating to limit blood sugar spikes.  - Reviewed carb counting and importance of accurate carb counting.  - Discussed signs and  symptoms of hypoglycemia. Always have glucose available.  - POCT glucose and hemoglobin A1c  - Reviewed growth chart.  - Discussed traveling with diabetes and carrying insulin on plane/boat.  Lab Orders         COMPLETE METABOLIC PANEL WITH GFR         Lipid panel         Microalbumin / creatinine urine ratio         T4, free         TSH         VITAMIN D 25 Hydroxy (Vit-D Deficiency, Fractures)         Lipoprotein A (LPA)         POCT glycosylated hemoglobin (Hb A1C)         POCT Glucose (Device for Home Use)       4. Elevated alkaline phosphatase /Hypovitamin D / genetic syndrome SATB2 - Continue 4000 units of Ergo daily  - She has been evaluated by [name] and genetics. Lab Orders         COMPLETE METABOLIC PANEL WITH GFR         Lipid panel         Microalbumin / creatinine urine ratio         T4, free         TSH         VITAMIN D 25 Hydroxy (Vit-D Deficiency, Fractures)         Lipoprotein A (LPA)         POCT glycosylated hemoglobin (Hb A1C)         POCT Glucose (Device for Home Use)       5. Hyperlipidemia  - 5 mg of Rosuvastatin once daily. Discussed possible side effects.  Lab Orders         COMPLETE METABOLIC PANEL WITH GFR         Lipid panel         Microalbumin / creatinine urine ratio         T4, free         TSH         VITAMIN D 25 Hydroxy (Vit-D Deficiency,  Fractures)         Lipoprotein A (LPA)         POCT glycosylated hemoglobin (Hb A1C)         POCT Glucose (Device for Home Use)        Follow-up:  3 months.   LOS:>40  spent today reviewing the medical chart, counseling the patient/family, and documenting today's visit.   When a patient is on insulin, intensive monitoring of blood glucose levels is necessary to avoid hyperglycemia and hypoglycemia. Severe hyperglycemia/hypoglycemia can lead to hospital admissions and be life threatening.      Gretchen Short,  FNP-C  Pediatric Specialist  [address]   [place], [address]  Tele: [phone]

## 2022-10-30 NOTE — Patient Instructions (Signed)
It was a pleasure seeing you in clinic today. Please do not hesitate to contact me if you have questions or concerns.   Please sign up for MyChart. This is a communication tool that allows you to send an email directly to me. This can be used for questions, prescriptions and blood sugar reports. We will also release labs to you with instructions on MyChart. Please do not use MyChart if you need immediate or emergency assistance. Ask our wonderful front office staff if you need assistance.   - continue current insulin plan  - Labs ordered. Any day except Thursday  - Continue rosuvastatin and vitamin D.

## 2022-10-31 ENCOUNTER — Other Ambulatory Visit (INDEPENDENT_AMBULATORY_CARE_PROVIDER_SITE_OTHER): Payer: Self-pay

## 2022-10-31 ENCOUNTER — Encounter (INDEPENDENT_AMBULATORY_CARE_PROVIDER_SITE_OTHER): Payer: Self-pay

## 2022-10-31 MED ORDER — BD PEN NEEDLE NANO U/F 32G X 4 MM MISC: 5 refills | Status: DC

## 2022-11-05 ENCOUNTER — Encounter (INDEPENDENT_AMBULATORY_CARE_PROVIDER_SITE_OTHER): Payer: Self-pay

## 2022-11-05 ENCOUNTER — Other Ambulatory Visit (INDEPENDENT_AMBULATORY_CARE_PROVIDER_SITE_OTHER): Payer: Self-pay

## 2022-11-05 DIAGNOSIS — E109 Type 1 diabetes mellitus without complications: Secondary | ICD-10-CM

## 2022-11-05 MED ORDER — DEXCOM G7 SENSOR MISC
5 refills | Status: DC
Start: 2022-11-05 — End: 2023-01-28

## 2022-11-06 ENCOUNTER — Other Ambulatory Visit (INDEPENDENT_AMBULATORY_CARE_PROVIDER_SITE_OTHER): Payer: Self-pay | Admitting: Family

## 2022-11-11 ENCOUNTER — Encounter (INDEPENDENT_AMBULATORY_CARE_PROVIDER_SITE_OTHER): Payer: Self-pay

## 2022-11-11 LAB — MICROALBUMIN / CREATININE URINE RATIO
Creatinine, Urine: 122 mg/dL (ref 20–275)
Microalb Creat Ratio: 8 mg/g{creat} (ref <30)
Microalb, Ur: 1 mg/dL

## 2022-11-11 LAB — COMPLETE METABOLIC PANEL WITH GFR
AG Ratio: 1.7 (calc) (ref 1.0–2.5)
ALT: 9 U/L (ref 6–29)
AST: 12 U/L (ref 10–30)
Albumin: 4.2 g/dL (ref 3.6–5.1)
Alkaline phosphatase (APISO): 724 U/L — ABNORMAL HIGH (ref 31–125)
BUN: 11 mg/dL (ref 7–25)
CO2: 23 mmol/L (ref 20–32)
Calcium: 9.5 mg/dL (ref 8.6–10.2)
Chloride: 103 mmol/L (ref 98–110)
Creat: 0.57 mg/dL (ref 0.50–0.96)
Globulin: 2.5 g/dL (calc) (ref 1.9–3.7)
Glucose, Bld: 108 mg/dL — ABNORMAL HIGH (ref 65–99)
Potassium: 4.2 mmol/L (ref 3.5–5.3)
Sodium: 138 mmol/L (ref 135–146)
Total Bilirubin: 0.4 mg/dL (ref 0.2–1.2)
Total Protein: 6.7 g/dL (ref 6.1–8.1)
eGFR: 132 mL/min/{1.73_m2} (ref >=60)

## 2022-11-11 LAB — LIPID PANEL
Cholesterol: 183 mg/dL (ref <200)
HDL: 57 mg/dL (ref >=50)
LDL Cholesterol (Calc): 100 mg/dL — ABNORMAL HIGH
Non-HDL Cholesterol (Calc): 126 mg/dL (ref <130)
Total CHOL/HDL Ratio: 3.2 (calc) (ref <5.0)
Triglycerides: 161 mg/dL — ABNORMAL HIGH (ref <150)

## 2022-11-11 LAB — T4, FREE: Free T4: 0.8 ng/dL (ref 0.8–1.8)

## 2022-11-11 LAB — LIPOPROTEIN A (LPA): Lipoprotein (a): 232 nmol/L — ABNORMAL HIGH (ref <75)

## 2022-11-11 LAB — VITAMIN D 25 HYDROXY (VIT D DEFICIENCY, FRACTURES): Vit D, 25-Hydroxy: 53 ng/mL (ref 30–100)

## 2022-11-11 LAB — TSH: TSH: 3.28 m[IU]/L

## 2022-12-29 ENCOUNTER — Encounter (INDEPENDENT_AMBULATORY_CARE_PROVIDER_SITE_OTHER): Payer: Self-pay

## 2022-12-29 DIAGNOSIS — E109 Type 1 diabetes mellitus without complications: Secondary | ICD-10-CM

## 2022-12-30 MED ORDER — GVOKE HYPOPEN 2-PACK 1 MG/0.2ML ~~LOC~~ SOAJ
SUBCUTANEOUS | 3 refills | Status: AC
Start: 2022-12-30 — End: ?

## 2022-12-30 NOTE — Telephone Encounter (Signed)
Spoke with Melanie Morrow, [employer] said he prefers GVOKE or Baqsimi. [name] indicated he spoke with patient about Glucagon injection being discontinued during the last visit.

## 2023-01-01 ENCOUNTER — Encounter (INDEPENDENT_AMBULATORY_CARE_PROVIDER_SITE_OTHER): Payer: Self-pay

## 2023-01-28 ENCOUNTER — Ambulatory Visit (INDEPENDENT_AMBULATORY_CARE_PROVIDER_SITE_OTHER): Payer: Medicaid Other | Admitting: Family

## 2023-01-28 ENCOUNTER — Encounter (INDEPENDENT_AMBULATORY_CARE_PROVIDER_SITE_OTHER): Payer: Self-pay | Admitting: Family

## 2023-01-28 VITALS — BP 104/82 | HR 80 | Wt 137.6 lb

## 2023-01-28 DIAGNOSIS — Z1589 Genetic susceptibility to other disease: Secondary | ICD-10-CM

## 2023-01-28 DIAGNOSIS — R6250 Unspecified lack of expected normal physiological development in childhood: Secondary | ICD-10-CM | POA: Diagnosis not present

## 2023-01-28 DIAGNOSIS — E559 Vitamin D deficiency, unspecified: Secondary | ICD-10-CM | POA: Diagnosis not present

## 2023-01-28 DIAGNOSIS — Z79899 Other long term (current) drug therapy: Secondary | ICD-10-CM

## 2023-01-28 DIAGNOSIS — E109 Type 1 diabetes mellitus without complications: Secondary | ICD-10-CM | POA: Diagnosis not present

## 2023-01-28 DIAGNOSIS — E1065 Type 1 diabetes mellitus with hyperglycemia: Secondary | ICD-10-CM | POA: Insufficient documentation

## 2023-01-28 DIAGNOSIS — E785 Hyperlipidemia, unspecified: Secondary | ICD-10-CM

## 2023-01-28 DIAGNOSIS — R748 Abnormal levels of other serum enzymes: Secondary | ICD-10-CM

## 2023-01-28 LAB — POCT GLYCOSYLATED HEMOGLOBIN (HGB A1C): Hemoglobin A1C: 6.7 % — AB (ref 4.0–5.6)

## 2023-01-28 LAB — POCT GLUCOSE (DEVICE FOR HOME USE): POC Glucose: 138 mg/dL — AB (ref 70–99)

## 2023-01-28 MED ORDER — LANTUS SOLOSTAR 100 UNIT/ML ~~LOC~~ SOPN: PEN_INJECTOR | SUBCUTANEOUS | 1 refills | Status: DC

## 2023-01-28 MED ORDER — NOVOLOG FLEXPEN 100 UNIT/ML ~~LOC~~ SOPN: PEN_INJECTOR | SUBCUTANEOUS | 1 refills | Status: DC

## 2023-01-28 MED ORDER — ROSUVASTATIN CALCIUM 5 MG PO TABS: 5.0000 mg | ORAL_TABLET | Freq: Every day | ORAL | 1 refills | Status: DC

## 2023-01-28 MED ORDER — DEXCOM G7 SENSOR MISC: 5 refills | Status: DC

## 2023-01-28 NOTE — Progress Notes (Signed)
Pediatric Endocrinology Diabetes Consultation Follow-up Visit  Melanie Morrow 09/12/2000 [id number]  Chief Complaint: Follow-up type 1 diabetes   Melanie Morrow   HPI: Melanie Morrow  is a 22 y.o. female presenting for follow-up of type 1 diabetes. she is accompanied to this visit by her mother.  1. Melanie Morrow is a 56 y.o. with history of pervasive developmental disorder (also non-verbal) who was diagnosed with T1DM on 08/22/16 after presenting to her PCP with polyuria, polydipsia, weight loss and hyperglycemia.  At PCP's office she had BG of 238 with UA showing 2+ glucose and 2+ ketones.  She was admitted to [facility] pediatric floor where she was not in DKA.  She was started on an MDI regimen.  TFTs on admission were normal with negative ICA Ab and negative insulin Ab; GAD Ab were positive.  C-peptide was low at 1.9.  A1c was 10%.  2. Since last visit on 10/2022 she has been well.    They had a great time on the cruise, [place] did well with her diabetes while they were there. Otherwise, not many changes since her last visit.   Using Dexcom G7 which is working well for her, rarely has sensor failures. Blood sugars have been well balanced overall and "normal for Melanie Morrow". She does not have hypoglycemia often, she is not able to feel her lows. Insulin is administered by her parents or grandparents after meals. Carb intake is highest at breakfast .   Takes 5 mg of rosuvastatin once daily.   4000 units of vitamin D3 daily. She missed doses while on the cruise.   Insulin regimen: Lantus 16 units  . Novolog 150/50/10 half unit plan. Uses 150/50/15 at lunch on Thursdays when she is at the day program. .  Hypoglycemia: Few lows since last visit, no glucagon needed. CGM Download: Using dexcom   Med-alert ID: Wearing a new bracelet today Injection sites: arms, legs and abdomen. Rotating.  Uses arms for CGM  Annual labs due: 07/2023 Ophthalmology due: Had exam on 03/2022, due in 1 year.     ROS:  All systems reviewed with pertinent positives listed below; otherwise negative. Constitutional: Sleeping well. Weight is stable.  Eyes; no vision changes. No blurry vision.  HENT: No difficulty swallowing. No neck pain  Respiratory: No increased work of breathing currently GI: No constipation or diarrhea GU: Occasional spotting. Has IUD.  Musculoskeletal: No joint deformity Neuro: Normal affect. No tremors or headaches.  Endocrine: As above    Past Medical History:   Past Medical History:  Diagnosis Date   Cognitive impairment    Development delay    Has been evaluated by genetics and has normal chromosomes   Dyspraxia    Non-verbal learning disorder    Type 1 diabetes mellitus (HCC) 08/22/2016   +GAD Ab, neg insulin Ab and ICA Ab    Medications:  Outpatient Encounter Medications as of 01/28/2023  Medication Sig   Alcohol Swabs (ALCOHOL PADS) 70 % PADS 6 wipes per day   Blood Glucose Monitoring Suppl (ACCU-CHEK GUIDE ME) w/Device KIT Use to check blood sugar up to 6 times a day   Continuous Blood Gluc Receiver (DEXCOM G7 RECEIVER) DEVI Use with dexcom G7 sensor/transmitter   Continuous Glucose Sensor (DEXCOM G7 SENSOR) MISC Change every 10 days   Glucagon (GVOKE HYPOPEN 2-PACK) 1 MG/0.2ML SOAJ Inject subcutaneously or intramuscularly prn severe hypoglycemia or unresponsiveness.   Glucagon, rDNA, (GLUCAGON EMERGENCY) 1 MG KIT FOLLOW PACKAGE DIRECTIONS FOR LOW BLOOD SUGAR.   insulin  glargine (LANTUS SOLOSTAR) 100 UNIT/ML Solostar Pen INJECT UP TO 50 UNITS SUBCUTANEOUSLY DAILY   Insulin Pen Needle (BD PEN NEEDLE NANO U/F) 32G X 4 MM MISC Use to inject insulin 6x daily   levonorgestrel (MIRENA) 20 MCG/DAY IUD by Intrauterine route.   Norethin Ace-Eth Estrad-FE 1-20 MG-MCG(24) CHEW Chew by mouth.   NOVOLOG FLEXPEN 100 UNIT/ML FlexPen USE UP TO 50 UNITS DAILY.   rosuvastatin (CRESTOR) 5 MG tablet Take 1 tablet (5 mg total) by mouth daily.   acetaminophen (TYLENOL CHILDRENS)  160 MG/5ML suspension Take 20.3 mLs (650 mg total) by mouth every 6 (six) hours as needed for moderate pain or fever. (Patient not taking: Reported on 10/26/2019)   diazePAM 5 MG/5ML SOLN Take by mouth. (Patient not taking: Reported on 01/28/2023)   ibuprofen (ADVIL) 100 MG/5ML suspension Take 20 mLs (400 mg total) by mouth every 6 (six) hours as needed for fever. (Patient not taking: Reported on 10/26/2019)   No facility-administered encounter medications on file as of 01/28/2023.    Allergies: Allergies  Allergen Reactions   Penicillins Hives   Amoxicillin Hives and Rash   Cefdinir Rash    Surgical History: Past Surgical History:  Procedure Laterality Date   DILATION AND CURETTAGE, DIAGNOSTIC / THERAPEUTIC     INTRAUTERINE DEVICE (IUD) INSERTION     MULTIPLE TOOTH EXTRACTIONS Bilateral     Family History:  Family History  Problem Relation Age of Onset   Diabetes Mother    Hyperlipidemia Mother    Diabetes Maternal Uncle    Diabetes Maternal Grandfather    Dementia Maternal Grandfather    Hyperlipidemia Maternal Grandfather    Hypertension Maternal Grandfather    Hypertension Paternal Grandmother    No family history of T1DM or hypothyroidism   Social History: Lives with: parents, grandparents also involved in her care   Physical Exam:  Vitals:   01/28/23 1101  BP: 104/82  Pulse: 80  Weight: 137 lb 9.6 oz (62.4 kg)       BP 104/82 (BP Location: Left Arm, Patient Position: Sitting, Cuff Size: Normal)   Pulse 80   Wt 137 lb 9.6 oz (62.4 kg) Comment: Patient refused to take shoes off  BMI 26.28 kg/m  Body mass index: body mass index is 26.28 kg/m. Growth %ile SmartLinks can only be used for patients less than 69 years old.  Ht Readings from Last 3 Encounters:  10/26/19 5' 0.67" (1.541 m) (8%, Z= -1.42)*  10/07/19 5' 0.5" (1.537 m) (7%, Z= -1.49)*  07/22/19 5' 0.63" (1.54 m) (8%, Z= -1.43)*   * Growth percentiles are based on CDC (Girls, 2-20 Years) data.    Wt Readings from Last 3 Encounters:  01/28/23 137 lb 9.6 oz (62.4 kg)  10/30/22 136 lb 11.2 oz (62 kg)  07/29/22 133 lb 9.6 oz (60.6 kg)   General: Well developed, well nourished female in no acute distress. Head: Normocephalic, atraumatic.   Eyes:  Pupils equal and round. EOMI.   Sclera white.  No eye drainage.   Ears/Nose/Mouth/Throat: Nares patent, no nasal drainage.  Normal dentition, mucous membranes moist.   Neck: supple, no cervical lymphadenopathy, no thyromegaly Cardiovascular: regular rate, normal S1/S2, no murmurs Respiratory: No increased work of breathing.  Lungs clear to auscultation bilaterally.  No wheezes. Abdomen: soft, nontender, nondistended. No appreciable masses  Extremities: warm, well perfused, cap refill < 2 sec.   Musculoskeletal: Normal muscle mass.  Normal strength Skin: warm, dry.  No rash or lesions. Neurologic: alert and oriented,  normal speech, no tremor   Labs:   Results for orders placed or performed in visit on 01/28/23  POCT Glucose (Device for Home Use)  Result Value Ref Range   Glucose Fasting, POC     POC Glucose 138 (A) 70 - 99 mg/dl  POCT glycosylated hemoglobin (Hb A1C)  Result Value Ref Range   Hemoglobin A1C 6.7 (A) 4.0 - 5.6 %   HbA1c POC (<> result, manual entry)     HbA1c, POC (prediabetic range)     HbA1c, POC (controlled diabetic range)       Assessment/Plan: Pranavi is a 22 y.o. female with type 1 diabetes on MDI and Dexcom in good control. Angelina's hemoglobin A1 is 6.7% with minimal hypoglycemia. Her time in target range is 51%.   1-3. Controlled diabetes mellitus type 1 without complications (HCC)/Developmental delay/Insulin titration.  - Reviewed  CGM download. Discussed trends and patterns.  - Rotate injection sites to prevent scar tissue.  - bolus 15 minutes prior to eating to limit blood sugar spikes.  - Reviewed carb counting and importance of accurate carb counting.  - Discussed signs and symptoms of  hypoglycemia. Always have glucose available.  - POCT glucose and hemoglobin A1c  - Reviewed growth chart.  - Discussed transition to adult endocrinology. Will plan to schedule after her next appointment with me.  Lab Orders         POCT Glucose (Device for Home Use)         POCT glycosylated hemoglobin (Hb A1C)        4. Elevated alkaline phosphatase /Hypovitamin D / genetic syndrome SATB2 - Continue 4000 units of Ergo daily  - She has been evaluated by [name] and genetics.    5. Hyperlipidemia  - 5 mg of Rosuvastatin once daily. Discussed possible side effects.  - Repeat fasting lipid panel at next visit.  Lab Orders         POCT Glucose (Device for Home Use)         POCT glycosylated hemoglobin (Hb A1C)         Follow-up:  3 months.   LOS:>40  spent today reviewing the medical chart, counseling the patient/family, and documenting today's visit.    When a patient is on insulin, intensive monitoring of blood glucose levels is necessary to avoid hyperglycemia and hypoglycemia. Severe hyperglycemia/hypoglycemia can lead to hospital admissions and be life threatening.      Gretchen Short,  FNP-C  Pediatric Specialist  [address]  [place], [address]  Tele: [phone]

## 2023-01-28 NOTE — Patient Instructions (Signed)
It was a pleasure seeing you in clinic today. Please do not hesitate to contact me if you have questions or concerns.   Please sign up for MyChart. This is a communication tool that allows you to send an email directly to me. This can be used for questions, prescriptions and blood sugar reports. We will also release labs to you with instructions on MyChart. Please do not use MyChart if you need immediate or emergency assistance. Ask our wonderful front office staff if you need assistance.   - A1c is 6.7%  - Fasting lipids at next visit.  - Rosuvastatin 5 mg once daily

## 2023-02-25 ENCOUNTER — Encounter (HOSPITAL_BASED_OUTPATIENT_CLINIC_OR_DEPARTMENT_OTHER): Payer: Self-pay | Admitting: Obstetrics and Gynecology

## 2023-02-26 ENCOUNTER — Encounter (HOSPITAL_BASED_OUTPATIENT_CLINIC_OR_DEPARTMENT_OTHER): Payer: Self-pay | Admitting: Obstetrics and Gynecology

## 2023-02-26 ENCOUNTER — Encounter (INDEPENDENT_AMBULATORY_CARE_PROVIDER_SITE_OTHER): Payer: Self-pay

## 2023-02-26 ENCOUNTER — Other Ambulatory Visit: Payer: Self-pay

## 2023-02-26 NOTE — Progress Notes (Signed)
Spoke w/ via phone for pre-op interview---Patient's mother and legal guardian, Delice Bison Lab needs dos----cbc, UPT per surgeon         Lab results------none COVID test -----patient states asymptomatic no test needed Arrive at -------0915 on Monday, 03/03/2023 NPO after MN NO Solid Food.  Clear liquids from MN until---0815 Med rec completed Medications to take morning of surgery -----none Diabetic medication -----Take 1/2 dose of Lantus insulin the night before surgery. Do not take any insulin the morning of surgery. Patient instructed no nail polish to be worn day of surgery Patient instructed to bring photo id and insurance card day of surgery Patient aware to have Driver (ride ) / caregiver    for 24 hours after surgery - mother, Delice Bison Patient Special Instructions -----none Pre-Op special Instructions -----Patient wears a Dexcom glucose sensor. Per her mother, it will be on her left or right arm. Patient carries a doll with her and likes to take it places. She will most likely want to keep the doll with her as much as possible. Mother should be with child in pre-op and post-op as much as possible. Patient verbalized understanding of instructions that were given at this phone interview. Patient denies chest pain, sob, fever, cough at the interview.

## 2023-02-28 NOTE — Progress Notes (Signed)
Spoke with Delice Bison (mother) regarding arrival time change to 57. NPO solids after midnight, clear liquids until 0430. All questions answered. Mother verbalized understanding.

## 2023-03-02 NOTE — H&P (Signed)
Melanie Morrow is an 22 y.o. female G0 with genetic disorder and menorrhagia, For IUD replacement (originally placed in 2017), EUA, pap, and GC/Chl screening.  D/W pt's mother r/b/a, process and expectations of IUD replacement, also d/w pt age appropriate screening.  Pt non verbal, would not tolerate exam in office  Pertinent Gynecological History: G0 No pap No STI Some irr VB, was taking OCP cont to control with IUD  Menstrual History:  No LMP recorded. (Menstrual status: IUD).    Past Medical History:  Diagnosis Date   Cognitive impairment    Patient has SAT B2 associated syndrome.   Development delay    Has been evaluated by genetics and has normal chromosomes   Dyspraxia    Family history of premature CAD    History of sinus tachycardia    Cardiologist, Dr. Fawn Kirk. 01/03/22 echocardiogram in Care Everywhere.   Non-verbal learning disorder    Type 1 diabetes mellitus (HCC) 08/22/2016   +GAD Ab, neg insulin Ab and ICA Ab / Follows with [facility].    Past Surgical History:  Procedure Laterality Date   DILATION AND CURETTAGE, DIAGNOSTIC / THERAPEUTIC  2017   INTRAUTERINE DEVICE (IUD) INSERTION  2017   MULTIPLE TOOTH EXTRACTIONS Bilateral     Family History  Problem Relation Age of Onset   Diabetes Mother    Hyperlipidemia Mother    Diabetes Maternal Uncle    Diabetes Maternal Grandfather    Dementia Maternal Grandfather    Hyperlipidemia Maternal Grandfather    Hypertension Maternal Grandfather    Hypertension Paternal Grandmother     Social History:  reports that she has never smoked. She has never been exposed to tobacco smoke. She has never used smokeless tobacco. She reports that she does not drink alcohol and does not use drugs. single  Allergies:  Allergies  Allergen Reactions   Amoxicillin Hives and Rash   Cefdinir Rash   Penicillins Hives    Meds: Lantus/Novolog, Mibelas, Mirena IUD, rosuvastatin  Review of Systems   Constitutional: Negative.   HENT: Negative.    Respiratory: Negative.    Gastrointestinal: Negative.   Genitourinary:  Positive for menstrual problem.  Musculoskeletal: Negative.   Skin: Negative.   Neurological: Negative.   Psychiatric/Behavioral: Negative.      Height 5\' 1"  (1.549 m), weight 63.5 kg. Physical Exam Constitutional:      Appearance: Normal appearance.  HENT:     Head: Normocephalic and atraumatic.  Cardiovascular:     Rate and Rhythm: Normal rate and regular rhythm.  Pulmonary:     Effort: Pulmonary effort is normal.     Breath sounds: Normal breath sounds.  Abdominal:     General: Bowel sounds are normal.     Palpations: Abdomen is soft.  Musculoskeletal:        General: Normal range of motion.     Cervical back: Normal range of motion and neck supple.  Skin:    General: Skin is warm and dry.  Neurological:     General: No focal deficit present.     Mental Status: She is alert and oriented to person, place, and time.  Psychiatric:        Mood and Affect: Mood normal.        Behavior: Behavior normal.     Assessment/Plan: 22yo G0 for EUA, IUD replacement, Pap, GC/Chl screening D/w pt and mother r/b/a, process and expectations Will proceed  Kiffany Schelling Bovard-Stuckert 03/02/2023, 12:38 PM

## 2023-03-02 NOTE — Anesthesia Preprocedure Evaluation (Signed)
Anesthesia Evaluation  Patient identified by MRN, date of birth, ID band Patient awake    Reviewed: Allergy & Precautions, NPO status , Patient's Chart, lab work & pertinent test results  History of Anesthesia Complications Negative for: history of anesthetic complications  Airway Mallampati: II  TM Distance: >3 FB Neck ROM: Full    Dental  (+) Missing, Poor Dentition   Pulmonary neg pulmonary ROS   Pulmonary exam normal        Cardiovascular negative cardio ROS Normal cardiovascular exam     Neuro/Psych nonverbal    GI/Hepatic negative GI ROS, Neg liver ROS,,,  Endo/Other  diabetes, Type 1, Insulin Dependent    Renal/GU negative Renal ROS  negative genitourinary   Musculoskeletal negative musculoskeletal ROS (+)    Abdominal   Peds  (+) Developmental delay Hematology negative hematology ROS (+)   Anesthesia Other Findings nonverbal  Reproductive/Obstetrics negative OB ROS                              Anesthesia Physical Anesthesia Plan  ASA: 2  Anesthesia Plan: General   Post-op Pain Management: Toradol IV (intra-op)* and Ofirmev IV (intra-op)*   Induction: Intravenous  PONV Risk Score and Plan: 3 and Treatment may vary due to age or medical condition, Ondansetron, Dexamethasone, Midazolam and Scopolamine patch - Pre-op  Airway Management Planned: LMA  Additional Equipment: None  Intra-op Plan:   Post-operative Plan: Extubation in OR  Informed Consent: I have reviewed the patients History and Physical, chart, labs and discussed the procedure including the risks, benefits and alternatives for the proposed anesthesia with the patient or authorized representative who has indicated his/her understanding and acceptance.     Dental advisory given and Consent reviewed with POA  Plan Discussed with: CRNA  Anesthesia Plan Comments:         Anesthesia Quick  Evaluation

## 2023-03-03 ENCOUNTER — Other Ambulatory Visit (HOSPITAL_COMMUNITY)
Admission: RE | Admit: 2023-03-03 | Discharge: 2023-03-03 | Disposition: A | Discharge status: Home / Self Care | Payer: Medicaid Other | Source: Ambulatory Visit | Attending: Obstetrics and Gynecology | Admitting: Obstetrics and Gynecology

## 2023-03-03 ENCOUNTER — Ambulatory Visit (HOSPITAL_BASED_OUTPATIENT_CLINIC_OR_DEPARTMENT_OTHER)
Admission: RE | Admit: 2023-03-03 | Discharge: 2023-03-03 | Disposition: A | Discharge status: Home / Self Care | Payer: Medicaid Other | Attending: Obstetrics and Gynecology | Admitting: Obstetrics and Gynecology

## 2023-03-03 ENCOUNTER — Ambulatory Visit (HOSPITAL_BASED_OUTPATIENT_CLINIC_OR_DEPARTMENT_OTHER): Payer: Medicaid Other | Admitting: Anesthesiology

## 2023-03-03 ENCOUNTER — Encounter (HOSPITAL_BASED_OUTPATIENT_CLINIC_OR_DEPARTMENT_OTHER): Payer: Self-pay | Admitting: Obstetrics and Gynecology

## 2023-03-03 ENCOUNTER — Other Ambulatory Visit: Payer: Self-pay

## 2023-03-03 ENCOUNTER — Encounter (HOSPITAL_BASED_OUTPATIENT_CLINIC_OR_DEPARTMENT_OTHER)
Admission: RE | Disposition: A | Discharge status: Home / Self Care | Payer: Self-pay | Source: Home / Self Care | Attending: Obstetrics and Gynecology

## 2023-03-03 DIAGNOSIS — Z30433 Encounter for removal and reinsertion of intrauterine contraceptive device: Secondary | ICD-10-CM

## 2023-03-03 DIAGNOSIS — E109 Type 1 diabetes mellitus without complications: Secondary | ICD-10-CM | POA: Diagnosis not present

## 2023-03-03 DIAGNOSIS — Z794 Long term (current) use of insulin: Secondary | ICD-10-CM | POA: Insufficient documentation

## 2023-03-03 DIAGNOSIS — Z01419 Encounter for gynecological examination (general) (routine) without abnormal findings: Secondary | ICD-10-CM | POA: Diagnosis present

## 2023-03-03 DIAGNOSIS — Z01818 Encounter for other preprocedural examination: Secondary | ICD-10-CM

## 2023-03-03 DIAGNOSIS — Z0000 Encounter for general adult medical examination without abnormal findings: Secondary | ICD-10-CM

## 2023-03-03 DIAGNOSIS — N926 Irregular menstruation, unspecified: Secondary | ICD-10-CM

## 2023-03-03 HISTORY — DX: Personal history of other diseases of the circulatory system: Z86.79

## 2023-03-03 HISTORY — PX: IUD REMOVAL: SHX5392

## 2023-03-03 HISTORY — PX: INTRAUTERINE DEVICE (IUD) INSERTION: SHX5877

## 2023-03-03 HISTORY — DX: Family history of ischemic heart disease and other diseases of the circulatory system: Z82.49

## 2023-03-03 LAB — POCT PREGNANCY, URINE: Preg Test, Ur: NEGATIVE

## 2023-03-03 LAB — CBC
HCT: 36.9 % (ref 36.0–46.0)
Hemoglobin: 12.2 g/dL (ref 12.0–15.0)
MCH: 29 pg (ref 26.0–34.0)
MCHC: 33.1 g/dL (ref 30.0–36.0)
MCV: 87.9 fL (ref 80.0–100.0)
Platelets: 281 10*3/uL (ref 150–400)
RBC: 4.2 MIL/uL (ref 3.87–5.11)
RDW: 13.8 % (ref 11.5–15.5)
WBC: 6.1 10*3/uL (ref 4.0–10.5)
nRBC: 0 % (ref 0.0–0.2)

## 2023-03-03 LAB — GLUCOSE, CAPILLARY: Glucose-Capillary: 155 mg/dL — ABNORMAL HIGH (ref 70–99)

## 2023-03-03 SURGERY — EXAM UNDER ANESTHESIA
Anesthesia: General

## 2023-03-03 MED ORDER — LEVONORGESTREL 20 MCG/DAY IU IUD
INTRAUTERINE_SYSTEM | INTRAUTERINE | Status: AC
  Filled 2023-03-03: qty 1

## 2023-03-03 MED ORDER — KETOROLAC TROMETHAMINE 30 MG/ML IJ SOLN
INTRAMUSCULAR | Status: DC | PRN
  Administered 2023-03-03: 30 mg via INTRAVENOUS

## 2023-03-03 MED ORDER — FENTANYL CITRATE (PF) 100 MCG/2ML IJ SOLN
INTRAMUSCULAR | Status: DC | PRN
  Administered 2023-03-03: 25 ug via INTRAVENOUS
  Administered 2023-03-03: 50 ug via INTRAVENOUS
  Administered 2023-03-03: 25 ug via INTRAVENOUS

## 2023-03-03 MED ORDER — ACETAMINOPHEN 500 MG PO TABS
ORAL_TABLET | ORAL | Status: AC
Start: 2023-03-03 — End: ?
  Filled 2023-03-03: qty 2

## 2023-03-03 MED ORDER — MIDAZOLAM HCL 5 MG/5ML IJ SOLN
INTRAMUSCULAR | Status: DC | PRN
  Administered 2023-03-03: 2 mg via INTRAVENOUS

## 2023-03-03 MED ORDER — GABAPENTIN 300 MG PO CAPS
ORAL_CAPSULE | ORAL | Status: AC
  Filled 2023-03-03: qty 1

## 2023-03-03 MED ORDER — LEVONORGESTREL 20 MCG/DAY IU IUD
1.0000 | INTRAUTERINE_SYSTEM | INTRAUTERINE | Status: AC
  Administered 2023-03-03: 1 via INTRAUTERINE

## 2023-03-03 MED ORDER — GABAPENTIN 300 MG PO CAPS: 300.0000 mg | ORAL_CAPSULE | ORAL | Status: DC

## 2023-03-03 MED ORDER — LACTATED RINGERS IV SOLN: INTRAVENOUS | Status: DC

## 2023-03-03 MED ORDER — LIDOCAINE HCL 1 % IJ SOLN
INTRAMUSCULAR | Status: DC | PRN
  Administered 2023-03-03: 10 mL

## 2023-03-03 MED ORDER — IBUPROFEN 600 MG PO TABS: 600.0000 mg | ORAL_TABLET | Freq: Four times a day (QID) | ORAL | 1 refills | Status: AC | PRN

## 2023-03-03 MED ORDER — DROPERIDOL 2.5 MG/ML IJ SOLN: 0.6250 mg | Freq: Once | INTRAMUSCULAR | Status: DC | PRN

## 2023-03-03 MED ORDER — DEXAMETHASONE SODIUM PHOSPHATE 10 MG/ML IJ SOLN
INTRAMUSCULAR | Status: AC
  Filled 2023-03-03: qty 1

## 2023-03-03 MED ORDER — ACETAMINOPHEN 10 MG/ML IV SOLN
INTRAVENOUS | Status: DC | PRN
  Administered 2023-03-03: 1000 mg via INTRAVENOUS

## 2023-03-03 MED ORDER — ONDANSETRON HCL 4 MG/2ML IJ SOLN
INTRAMUSCULAR | Status: DC | PRN
  Administered 2023-03-03: 4 mg via INTRAVENOUS

## 2023-03-03 MED ORDER — ACETAMINOPHEN 500 MG PO TABS: 1000.0000 mg | ORAL_TABLET | ORAL | Status: DC

## 2023-03-03 MED ORDER — ACETAMINOPHEN 10 MG/ML IV SOLN
INTRAVENOUS | Status: AC
  Filled 2023-03-03: qty 100

## 2023-03-03 MED ORDER — POTASSIUM CHLORIDE IN NACL 20-0.45 MEQ/L-% IV SOLN
INTRAVENOUS | Status: DC
  Filled 2023-03-03: qty 1000

## 2023-03-03 MED ORDER — LIDOCAINE HCL (CARDIAC) PF 100 MG/5ML IV SOSY
PREFILLED_SYRINGE | INTRAVENOUS | Status: DC | PRN
  Administered 2023-03-03: 60 mg via INTRAVENOUS

## 2023-03-03 MED ORDER — DEXAMETHASONE SODIUM PHOSPHATE 4 MG/ML IJ SOLN
INTRAMUSCULAR | Status: DC | PRN
  Administered 2023-03-03: 5 mg via INTRAVENOUS

## 2023-03-03 MED ORDER — PROPOFOL 10 MG/ML IV BOLUS
INTRAVENOUS | Status: DC | PRN
  Administered 2023-03-03: 200 mg via INTRAVENOUS
  Administered 2023-03-03: 50 mg via INTRAVENOUS

## 2023-03-03 MED ORDER — STERILE WATER FOR IRRIGATION IR SOLN
Status: DC | PRN
  Administered 2023-03-03: 500 mL

## 2023-03-03 MED ORDER — PROPOFOL 10 MG/ML IV BOLUS
INTRAVENOUS | Status: AC
  Filled 2023-03-03: qty 20

## 2023-03-03 MED ORDER — KETOROLAC TROMETHAMINE 30 MG/ML IJ SOLN: 30.0000 mg | Freq: Once | INTRAMUSCULAR | Status: DC | PRN

## 2023-03-03 MED ORDER — KETOROLAC TROMETHAMINE 30 MG/ML IJ SOLN
INTRAMUSCULAR | Status: AC
  Filled 2023-03-03: qty 1

## 2023-03-03 MED ORDER — MIDAZOLAM HCL 2 MG/2ML IJ SOLN
INTRAMUSCULAR | Status: AC
  Filled 2023-03-03: qty 2

## 2023-03-03 MED ORDER — FENTANYL CITRATE (PF) 100 MCG/2ML IJ SOLN
INTRAMUSCULAR | Status: AC
  Filled 2023-03-03: qty 2

## 2023-03-03 MED ORDER — ONDANSETRON HCL 4 MG/2ML IJ SOLN
INTRAMUSCULAR | Status: AC
  Filled 2023-03-03: qty 2

## 2023-03-03 MED ORDER — FENTANYL CITRATE (PF) 100 MCG/2ML IJ SOLN: 25.0000 ug | INTRAMUSCULAR | Status: DC | PRN

## 2023-03-03 MED ORDER — POVIDONE-IODINE 10 % EX SWAB: 2.0000 | Freq: Once | CUTANEOUS | Status: DC

## 2023-03-03 SURGICAL SUPPLY — 13 items
GLOVE BIO SURGEON STRL SZ 6.5 (GLOVE) ×1 IMPLANT
GLOVE BIOGEL PI IND STRL 7.0 (GLOVE) ×1 IMPLANT
GLOVE SURG SS PI 7.0 STRL IVOR (GLOVE) IMPLANT
GOWN STRL REUS W/TWL LRG LVL3 (GOWN DISPOSABLE) ×2 IMPLANT
KIT TURNOVER CYSTO (KITS) ×1 IMPLANT
Mirena IUD IMPLANT
NDL SPNL 22GX3.5 QUINCKE BK (NEEDLE) ×1 IMPLANT
NEEDLE SPNL 22GX3.5 QUINCKE BK (NEEDLE) ×1 IMPLANT
PACK VAGINAL MINOR WOMEN LF (CUSTOM PROCEDURE TRAY) ×1 IMPLANT
PAD PREP 24X48 CUFFED NSTRL (MISCELLANEOUS) ×1 IMPLANT
SLEEVE SCD COMPRESS KNEE MED (STOCKING) ×1 IMPLANT
SOL PREP POV-IOD 4OZ 10% (MISCELLANEOUS) IMPLANT
TOWEL OR 17X24 6PK STRL BLUE (TOWEL DISPOSABLE) ×2 IMPLANT

## 2023-03-03 NOTE — Interval H&P Note (Signed)
History and Physical Interval Note:  03/03/2023 7:10 AM  Melanie Morrow  has presented today for surgery, with the diagnosis of contraception Z30.09 , gynecologic exam Z01.419.  The various methods of treatment have been discussed with the patient and family. After consideration of risks, benefits and other options for treatment, the patient has consented to  Procedure(s) with comments: EXAM UNDER ANESTHESIA (N/A) - PAP INTRAUTERINE DEVICE (IUD) INSERTION (N/A) INTRAUTERINE DEVICE (IUD) REMOVAL (N/A) as a surgical intervention.  The patient's history has been reviewed, patient examined, no change in status, stable for surgery.  I have reviewed the patient's chart and labs.  Questions were answered to the patient's satisfaction.     Lisanne Ponce Bovard-Stuckert

## 2023-03-03 NOTE — Brief Op Note (Signed)
03/03/2023  8:16 AM  PATIENT:  Melanie Morrow  22 y.o. female  PRE-OPERATIVE DIAGNOSIS:  contraception Z30.09 , gynecologic exam Z01.419, IUD replacement  POST-OPERATIVE DIAGNOSIS:  contraception Z30.09 , gynecologic exam Z01.419, IUD replacement  PROCEDURE:  Procedure(s) with comments: EXAM UNDER ANESTHESIA (N/A) - PAP INTRAUTERINE DEVICE (IUD) INSERTION (N/A) INTRAUTERINE DEVICE (IUD) REMOVAL (N/A)  SURGEON:  Surgeons and Role:    * Bovard-Stuckert, Trang Bouse, MD - Primary  ANESTHESIA:   general by LMA  EBL:  10 mL, IVF per anesthesia  LOCAL MEDICATIONS USED:  LIDOCAINE   SPECIMEN:  Source of Specimen:  Pap GC/Chl  DISPOSITION OF SPECIMEN:  PATHOLOGY  COUNTS:  YES  TOURNIQUET:  * No tourniquets in log *  DICTATION: .Other Dictation: Dictation Number [id number]  PLAN OF CARE: Discharge to home after PACU  PATIENT DISPOSITION:  PACU - hemodynamically stable.   Delay start of Pharmacological VTE agent (>24hrs) due to surgical blood loss or risk of bleeding: not applicable

## 2023-03-03 NOTE — Transfer of Care (Signed)
Immediate Anesthesia Transfer of Care Note  Patient: Cheniqua Hortman  Procedure(s) Performed: Procedure(s) (LRB): EXAM UNDER ANESTHESIA (N/A) INTRAUTERINE DEVICE (IUD) INSERTION (N/A) INTRAUTERINE DEVICE (IUD) REMOVAL (N/A)  Patient Location: PACU  Anesthesia Type: GA  Level of Consciousness: awake, sedated, patient w/ mother at bedside tearful   Airway & Oxygen Therapy: Patient Spontanous Breathing and Patient connected to [place] oxygen  Post-op Assessment: Report given to PACU RN, Post -op Vital signs reviewed and stable and Patient moving all extremities  Post vital signs: Reviewed and stable  Complications: No apparent anesthesia complications

## 2023-03-03 NOTE — Anesthesia Procedure Notes (Signed)
Procedure Name: LMA Insertion Date/Time: 03/03/2023 7:42 AM  Performed by: Jessica Priest, CRNAPre-anesthesia Checklist: Patient identified, Emergency Drugs available, Suction available, Patient being monitored and Timeout performed Patient Re-evaluated:Patient Re-evaluated prior to induction Oxygen Delivery Method: Circle system utilized Preoxygenation: Pre-oxygenation with 100% oxygen Induction Type: IV induction Ventilation: Mask ventilation without difficulty LMA: LMA inserted LMA Size: 4.0 Number of attempts: 1 Airway Equipment and Method: Bite block Placement Confirmation: positive ETCO2, breath sounds checked- equal and bilateral and CO2 detector Tube secured with: Tape Dental Injury: Teeth and Oropharynx as per pre-operative assessment

## 2023-03-03 NOTE — Anesthesia Postprocedure Evaluation (Signed)
Anesthesia Post Note  Patient: Melanie Morrow  Procedure(s) Performed: EXAM UNDER ANESTHESIA INTRAUTERINE DEVICE (IUD) INSERTION INTRAUTERINE DEVICE (IUD) REMOVAL     Patient location during evaluation: PACU Anesthesia Type: General Level of consciousness: awake and alert Pain management: pain level controlled Vital Signs Assessment: post-procedure vital signs reviewed and stable Respiratory status: spontaneous breathing, nonlabored ventilation and respiratory function stable Cardiovascular status: blood pressure returned to baseline Postop Assessment: no apparent nausea or vomiting Anesthetic complications: no   No notable events documented.  Last Vitals:  Vitals:   03/03/23 0834 03/03/23 0845  BP: (!) 117/49 122/65  Pulse: (!) 110 83  Resp: 14 20  Temp:    SpO2: 95% 100%    Last Pain:  Vitals:   03/03/23 9811  TempSrc: Oral                 Shanda Howells

## 2023-03-03 NOTE — Op Note (Unsigned)
NAME: Melanie Morrow, Melanie Morrow MEDICAL RECORD NO: [id number] ACCOUNT NO: [id number] DATE OF BIRTH: 13-Jun-2000 FACILITY: [facility] LOCATION: [facility] PHYSICIAN: Sherian Rein, MD  Operative Report   DATE OF PROCEDURE: 03/03/2023  PREOPERATIVE DIAGNOSES:  IUD replacement, general gynecologic exam with Pap and GC chlamydia screening.  PROCEDURE:  Exam under anesthesia as well as IUD removal and reinsertion.  SURGEON:  Sherian Rein, MD  ASSISTANT:  None.  ANESTHESIA:  General by LMA.  ESTIMATED BLOOD LOSS:  Approximately 10 mL.  INTRAVENOUS FLUIDS:  Per anesthesia.  COMPLICATIONS:  None.  PATHOLOGY:  Pap smear and gonorrhea and chlamydia screening.  DESCRIPTION OF PROCEDURE:  After informed consent was reviewed with the patient and her mother, she was transported to the operating room and placed on the table in the supine position.  General anesthesia was induced and found to be adequate.  An LMA  was placed.  She was then placed in the [place] stirrups.  A speculum was inserted into her vagina and Pap smear was collected.  Gonorrhea and chlamydia will go with the Pap smear.  After the Pap smear was collected, the IUD strings were grasped and IUD  was removed.  The uterus was then sounded to approximately 7.5 mL and the IUD was placed in the inserter up into the fundus of the uterus.  The strengths were trimmed at approximately 1 cm.  The patient was awakened and returned to stable condition and  transported to the PACU.  Sponge, lap and needle counts were correct x2 per the operating staff.    VAI D: 03/03/2023 8:22:00 am T: 03/03/2023 8:28:00 am  JOB: [id number]/ [id number]

## 2023-03-03 NOTE — Discharge Instructions (Signed)
   No acetaminophen/Tylenol until after 1:40 pm today if needed.  No ibuprofen, Advil, Aleve, Motrin, ketorolac, meloxicam, naproxen, or other NSAIDS until after 2:00 pm today if needed.     Post Anesthesia Home Care Instructions  Activity: Get plenty of rest for the remainder of the day. A responsible individual must stay with you for 24 hours following the procedure.  For the next 24 hours, DO NOT: -Drive a car -Advertising copywriter -Drink alcoholic beverages -Take any medication unless instructed by your physician -Make any legal decisions or sign important papers.  Meals: Start with liquid foods such as gelatin or soup. Progress to regular foods as tolerated. Avoid greasy, spicy, heavy foods. If nausea and/or vomiting occur, drink only clear liquids until the nausea and/or vomiting subsides. Call your physician if vomiting continues.  Special Instructions/Symptoms: Your throat may feel dry or sore from the anesthesia or the breathing tube placed in your throat during surgery. If this causes discomfort, gargle with warm salt water. The discomfort should disappear within 24 hours.

## 2023-03-04 LAB — CYTOLOGY - PAP
Adequacy: ABSENT
Chlamydia: NEGATIVE
Comment: NEGATIVE
Comment: NORMAL
Diagnosis: NEGATIVE
Neisseria Gonorrhea: NEGATIVE

## 2023-03-05 ENCOUNTER — Encounter (HOSPITAL_BASED_OUTPATIENT_CLINIC_OR_DEPARTMENT_OTHER): Payer: Self-pay | Admitting: Obstetrics and Gynecology

## 2023-05-13 ENCOUNTER — Ambulatory Visit (INDEPENDENT_AMBULATORY_CARE_PROVIDER_SITE_OTHER): Payer: Medicaid Other | Admitting: Family

## 2023-05-13 ENCOUNTER — Encounter (INDEPENDENT_AMBULATORY_CARE_PROVIDER_SITE_OTHER): Payer: Self-pay | Admitting: Family

## 2023-05-13 VITALS — BP 120/70 | HR 88 | Ht 61.14 in | Wt 135.0 lb

## 2023-05-13 DIAGNOSIS — E782 Mixed hyperlipidemia: Secondary | ICD-10-CM

## 2023-05-13 DIAGNOSIS — R6250 Unspecified lack of expected normal physiological development in childhood: Secondary | ICD-10-CM

## 2023-05-13 DIAGNOSIS — E559 Vitamin D deficiency, unspecified: Secondary | ICD-10-CM

## 2023-05-13 DIAGNOSIS — E785 Hyperlipidemia, unspecified: Secondary | ICD-10-CM | POA: Diagnosis not present

## 2023-05-13 DIAGNOSIS — E109 Type 1 diabetes mellitus without complications: Secondary | ICD-10-CM | POA: Diagnosis not present

## 2023-05-13 DIAGNOSIS — E1065 Type 1 diabetes mellitus with hyperglycemia: Secondary | ICD-10-CM

## 2023-05-13 DIAGNOSIS — R748 Abnormal levels of other serum enzymes: Secondary | ICD-10-CM

## 2023-05-13 DIAGNOSIS — Z79899 Other long term (current) drug therapy: Secondary | ICD-10-CM

## 2023-05-13 DIAGNOSIS — Z1589 Genetic susceptibility to other disease: Secondary | ICD-10-CM

## 2023-05-13 LAB — POCT GLYCOSYLATED HEMOGLOBIN (HGB A1C): Hemoglobin A1C: 6.6 % — AB (ref 4.0–5.6)

## 2023-05-13 LAB — POCT GLUCOSE (DEVICE FOR HOME USE): Glucose Fasting, POC: 151 mg/dL — AB (ref 70–99)

## 2023-05-13 MED ORDER — DEXCOM G7 SENSOR MISC: 5 refills | Status: DC

## 2023-05-13 NOTE — Progress Notes (Signed)
 Pediatric Endocrinology Diabetes Consultation Follow-up Visit  Melanie Morrow [birth year] [id number]  Chief Complaint: Follow-up type 1 diabetes   [name]   HPI: Melanie Morrow  is a 23 y.o. female presenting for follow-up of type 1 diabetes. she is accompanied to this visit by her mother.  1. Melanie Morrow is a 13 y.o. with history of pervasive developmental disorder (also non-verbal) who was diagnosed with T1DM on 08/22/16 after presenting to her PCP with polyuria, polydipsia, weight loss and hyperglycemia.  At PCP's office she had BG of 238 with UA showing 2+ glucose and 2+ ketones.  She was admitted to [facility] pediatric floor where she was not in DKA.  She was started on an MDI regimen.  TFTs on admission were normal with negative ICA Ab and negative insulin Ab; GAD Ab were positive.  C-peptide was low at 1.9.  A1c was 10%.  2. Since last visit on 01/2023 she has been well.    Melanie Morrow is going to day program two days per week now, she has caregivers that help with her diabetes and insulin administration.   Reports that diabetes care is going well overall. She wearing Dexcom G7 and has been performing well. Family doses insulin after eating meals. Carb intake is typically 50-100 grams per meal. She tends to run slightly higher after lunch but family feels this is safer since she is at her day program. Hypoglycemia is rare, none severe or requiring glucagon.   They had a great time on the cruise, Melanie Morrow did well with her diabetes while they were there. Otherwise, not many changes since her last visit.   Using Dexcom G7 which is working well for her, rarely has sensor failures. Blood sugars have been well balanced overall and "normal for Melanie Morrow". She does not have hypoglycemia often, she is not able to feel her lows. Insulin is administered by her parents or grandparents after meals. Carb intake is highest at breakfast .   Takes 5 mg of rosuvastatin once daily.   4000 units of  vitamin D3 daily. She missed doses while on the cruise.   Insulin regimen: Lantus 16 units  . Novolog 150/50/10 half unit plan. Uses 150/50/15 at lunch on Thursdays when she is at the day program. .  Hypoglycemia: Few lows since last visit, no glucagon needed. CGM Download: Using dexcom  Med-alert ID: Wearing a new bracelet today Injection sites: arms, legs and abdomen. Rotating.  Uses arms for CGM  Annual labs due: 07/2023 Ophthalmology due: Had exam on 03/2022, due in 1 year.    ROS:  All systems reviewed with pertinent positives listed below; otherwise negative. Constitutional: Sleeping well. Weight is stable.  Eyes; no vision changes. No blurry vision.  HENT: No difficulty swallowing. No neck pain  Respiratory: No increased work of breathing currently GI: No constipation or diarrhea GU: Occasional spotting. Has IUD.  Musculoskeletal: No joint deformity Neuro: Normal affect. No tremors or headaches.  Endocrine: As above    Past Medical History:   Past Medical History:  Diagnosis Date   Cognitive impairment    Patient has SAT B2 associated syndrome.   Development delay    Has been evaluated by genetics and has normal chromosomes   Dyspraxia    Family history of premature CAD    History of sinus tachycardia    Cardiologist, Dr. Fawn Kirk. 01/03/22 echocardiogram in Care Everywhere.   Non-verbal learning disorder    Type 1 diabetes mellitus (HCC) 08/22/2016   +GAD Ab, neg  insulin Ab and ICA Ab / Follows with [facility] Endocrinology.    Medications:  Outpatient Encounter Medications as of 05/13/2023  Medication Sig   Alcohol Swabs (ALCOHOL PADS) 70 % PADS 6 wipes per day   Blood Glucose Monitoring Suppl (ACCU-CHEK GUIDE ME) w/Device KIT Use to check blood sugar up to 6 times a day   Cholecalciferol (VITAMIN D3) 30 MCG/15ML LIQD Take by mouth. Drops on breakfast food in the am.   Continuous Blood Gluc Receiver (DEXCOM G7 RECEIVER) DEVI Use with dexcom G7  sensor/transmitter   Continuous Glucose Sensor (DEXCOM G7 SENSOR) MISC Change every 10 days   diazePAM 5 MG/5ML SOLN Take by mouth as needed. (Patient not taking: Reported on 02/26/2023)   Glucagon (GVOKE HYPOPEN 2-PACK) 1 MG/0.2ML SOAJ Inject subcutaneously or intramuscularly prn severe hypoglycemia or unresponsiveness.   Glucagon, rDNA, (GLUCAGON EMERGENCY) 1 MG KIT FOLLOW PACKAGE DIRECTIONS FOR LOW BLOOD SUGAR.   ibuprofen (ADVIL) 600 MG tablet Take 1 tablet (600 mg total) by mouth every 6 (six) hours as needed.   insulin glargine (LANTUS SOLOSTAR) 100 UNIT/ML Solostar Pen INJECT UP TO 50 UNITS SUBCUTANEOUSLY DAILY (Patient taking differently: at bedtime. INJECT UP TO 50 UNITS SUBCUTANEOUSLY DAILY)   Insulin Pen Needle (BD PEN NEEDLE NANO U/F) 32G X 4 MM MISC Use to inject insulin 6x daily   levonorgestrel (MIRENA) 20 MCG/DAY IUD by Intrauterine route.   Norethin Ace-Eth Estrad-FE 1-20 MG-MCG(24) CHEW Chew by mouth.   NOVOLOG FLEXPEN 100 UNIT/ML FlexPen Use up to 50 units daily.   rosuvastatin (CRESTOR) 5 MG tablet Take 1 tablet (5 mg total) by mouth daily. (Patient taking differently: Take 5 mg by mouth every evening.)   No facility-administered encounter medications on file as of 05/13/2023.    Allergies: Allergies  Allergen Reactions   Amoxicillin Hives and Rash   Cefdinir Rash   Penicillins Hives    Surgical History: Past Surgical History:  Procedure Laterality Date   DILATION AND CURETTAGE, DIAGNOSTIC / THERAPEUTIC  2017   INTRAUTERINE DEVICE (IUD) INSERTION  2017   INTRAUTERINE DEVICE (IUD) INSERTION N/A 03/03/2023   Procedure: INTRAUTERINE DEVICE (IUD) INSERTION;  Surgeon: Sherian Rein, MD;  Location: [facility];  Service: Gynecology;  Laterality: N/A;   IUD REMOVAL N/A 03/03/2023   Procedure: INTRAUTERINE DEVICE (IUD) REMOVAL;  Surgeon: Sherian Rein, MD;  Location: [facility];  Service: Gynecology;  Laterality: N/A;    MULTIPLE TOOTH EXTRACTIONS Bilateral     Family History:  Family History  Problem Relation Age of Onset   Diabetes Mother    Hyperlipidemia Mother    Diabetes Maternal Uncle    Diabetes Maternal Grandfather    Dementia Maternal Grandfather    Hyperlipidemia Maternal Grandfather    Hypertension Maternal Grandfather    Hypertension Paternal Grandmother    No family history of T1DM or hypothyroidism   Social History: Lives with: parents, grandparents also involved in her care   Physical Exam:  Vitals:   05/13/23 0838  BP: 120/70  Pulse: 88  Weight: 135 lb (61.2 kg)  Height: 5' 1.14" (1.553 m)      BP 120/70   Pulse 88   Ht 5' 1.14" (1.553 m)   Wt 135 lb (61.2 kg)   BMI 25.39 kg/m  Body mass index: body mass index is 25.39 kg/m. Growth %ile SmartLinks can only be used for patients less than 70 years old.  Ht Readings from Last 3 Encounters:  05/13/23 5' 1.14" (1.553 m)  03/03/23 5\' 1"  (1.549 m)  10/26/19 5' 0.67" (1.541 m) (8%, Z= -1.42)*   * Growth percentiles are based on CDC (Girls, 2-20 Years) data.   Wt Readings from Last 3 Encounters:  05/13/23 135 lb (61.2 kg)  03/03/23 135 lb (61.2 kg)  01/28/23 137 lb 9.6 oz (62.4 kg)   General: Well developed, well nourished female with developmental delay  in no acute distress.  Head: Normocephalic, atraumatic.   Eyes:  Pupils equal and round. EOMI.   Sclera white.  No eye drainage.   Ears/Nose/Mouth/Throat: Nares patent, no nasal drainage.  Normal dentition, mucous membranes moist.   Neck: supple, no cervical lymphadenopathy, no thyromegaly Cardiovascular: regular rate, normal S1/S2, no murmurs Respiratory: No increased work of breathing.  Lungs clear to auscultation bilaterally.  No wheezes. Abdomen: soft, nontender, nondistended. No appreciable masses  Extremities: warm, well perfused, cap refill < 2 sec.   Musculoskeletal: Normal muscle mass.  Normal strength Skin: warm, dry.  No rash or lesions. Neurologic:  alert and oriented, no tremor   Labs:   Results for orders placed or performed in visit on 05/13/23  POCT glycosylated hemoglobin (Hb A1C)   Collection Time: 05/13/23  8:45 AM  Result Value Ref Range   Hemoglobin A1C 6.6 (A) 4.0 - 5.6 %   HbA1c POC (<> result, manual entry)     HbA1c, POC (prediabetic range)     HbA1c, POC (controlled diabetic range)    POCT Glucose (Device for Home Use)   Collection Time: 05/13/23  8:45 AM  Result Value Ref Range   Glucose Fasting, POC 151 (A) 70 - 99 mg/dL   POC Glucose       Assessment/Plan: Melanie Morrow is a 23 y.o. female with type 1 diabetes on MDI and Dexcom in good control. Hemoglobin A1c is 6.6% which meets ADA goal of <7%. Her time in target range is 58% with minimal hypoglycemia. CGM download shows blood sugars in low 200s after lunch, however, since Melanie Morrow is unable to communicate when she feels low, I would not recommend increasing her mealtime insulin.   1-3. Controlled diabetes mellitus type 1 without complications (HCC)/Developmental delay/Insulin titration.  - Reviewed iCGM download. Discussed trends and patterns.  - Rotate injectionsites to prevent scar tissue.  - bolus 15 minutes prior to eating to limit blood sugar spikes.  - Reviewed carb counting and importance of accurate carb counting.  - Discussed signs and symptoms of hypoglycemia. Always have glucose available.  - POCT glucose and hemoglobin A1c  - Reviewed growth chart.  - School care plan signs for family so school can administer insulin  - Discussed transition to adult endocrinology to see Dr. Elvera Lennox, Plan to transition after her May visit.  Lab Orders         POCT glycosylated hemoglobin (Hb A1C)         POCT Glucose (Device for Home Use)      4. Elevated alkaline phosphatase /Hypovitamin D / genetic syndrome SATB2 - She has been evaluated by [name] and genetics. - 4000 units of vitamin D3 daily.   5. Hyperlipidemia  - Rosuvastatin 5 mg daily.   - fasting lipid panel ordered.  Lab Orders         POCT glycosylated hemoglobin (Hb A1C)         POCT Glucose (Device for Home Use)          Follow-up:  3 months.   LOS:49 minutes spent today reviewing the medical chart, counseling  the patient/family, and documenting today's visit. This time does not include CGM interpretation.  When a patient is on insulin, intensive monitoring of blood glucose levels is necessary to avoid hyperglycemia and hypoglycemia. Severe hyperglycemia/hypoglycemia can lead to hospital admissions and be life threatening.    Gretchen Short, DNP, FNP-C  Pediatric Specialist  [address]  [place], [address]  Tele: [phone]

## 2023-05-13 NOTE — Patient Instructions (Signed)
 It was a pleasure seeing you in clinic today. Please do not hesitate to contact me if you have questions or concerns.   Please sign up for MyChart. This is a communication tool that allows you to send an email directly to me. This can be used for questions, prescriptions and blood sugar reports. We will also release labs to you with instructions on MyChart. Please do not use MyChart if you need immediate or emergency assistance. Ask our wonderful front office staff if you need assistance.   Continue vitamin D supplement  - Novolog per plan  - Hemoglobin A1c is 6.6%. Excellent work.

## 2023-06-24 ENCOUNTER — Encounter (INDEPENDENT_AMBULATORY_CARE_PROVIDER_SITE_OTHER): Payer: Self-pay

## 2023-06-30 ENCOUNTER — Telehealth (INDEPENDENT_AMBULATORY_CARE_PROVIDER_SITE_OTHER): Payer: Self-pay | Admitting: Pharmacy Technician

## 2023-06-30 ENCOUNTER — Other Ambulatory Visit (INDEPENDENT_AMBULATORY_CARE_PROVIDER_SITE_OTHER): Payer: Self-pay | Admitting: Family

## 2023-06-30 ENCOUNTER — Encounter (INDEPENDENT_AMBULATORY_CARE_PROVIDER_SITE_OTHER): Payer: Self-pay

## 2023-06-30 ENCOUNTER — Other Ambulatory Visit (HOSPITAL_COMMUNITY): Payer: Self-pay

## 2023-06-30 NOTE — Telephone Encounter (Signed)
 Pharmacy Patient Advocate Encounter   Received notification from CoverMyMeds that prior authorization for Dexcom G7 Receiver device is required/requested.   Insurance verification completed.   The patient is insured through [facility] MEDICAID .   Per test claim: PA required and submitted KEY/EOC/Request #: [id number] APPROVED from 06/30/2023 to 12/27/2023. Ran test claim, Copay is $0.00. This test claim was processed through [facility]- copay amounts may vary at other pharmacies due to pharmacy/plan contracts, or as the patient moves through the different stages of their insurance plan.

## 2023-07-07 ENCOUNTER — Encounter (INDEPENDENT_AMBULATORY_CARE_PROVIDER_SITE_OTHER): Payer: Self-pay

## 2023-07-23 ENCOUNTER — Other Ambulatory Visit (INDEPENDENT_AMBULATORY_CARE_PROVIDER_SITE_OTHER): Payer: Self-pay | Admitting: Family

## 2023-08-08 ENCOUNTER — Ambulatory Visit (INDEPENDENT_AMBULATORY_CARE_PROVIDER_SITE_OTHER): Payer: Medicaid Other | Admitting: Family

## 2023-08-19 ENCOUNTER — Encounter (INDEPENDENT_AMBULATORY_CARE_PROVIDER_SITE_OTHER): Payer: Self-pay

## 2023-08-19 DIAGNOSIS — E1065 Type 1 diabetes mellitus with hyperglycemia: Secondary | ICD-10-CM

## 2023-08-19 MED ORDER — ACCU-CHEK FASTCLIX LANCETS MISC: 5 refills | Status: AC

## 2023-08-19 MED ORDER — ACCU-CHEK GUIDE TEST VI STRP: ORAL_STRIP | 5 refills | Status: AC

## 2023-08-22 ENCOUNTER — Ambulatory Visit (INDEPENDENT_AMBULATORY_CARE_PROVIDER_SITE_OTHER): Admitting: Family

## 2023-08-22 ENCOUNTER — Encounter (INDEPENDENT_AMBULATORY_CARE_PROVIDER_SITE_OTHER): Payer: Self-pay | Admitting: Family

## 2023-08-22 ENCOUNTER — Other Ambulatory Visit (INDEPENDENT_AMBULATORY_CARE_PROVIDER_SITE_OTHER): Payer: Self-pay | Admitting: Family

## 2023-08-22 VITALS — BP 110/80 | HR 90 | Wt 134.5 lb

## 2023-08-22 DIAGNOSIS — R748 Abnormal levels of other serum enzymes: Secondary | ICD-10-CM

## 2023-08-22 DIAGNOSIS — E109 Type 1 diabetes mellitus without complications: Secondary | ICD-10-CM

## 2023-08-22 DIAGNOSIS — R6250 Unspecified lack of expected normal physiological development in childhood: Secondary | ICD-10-CM

## 2023-08-22 DIAGNOSIS — E782 Mixed hyperlipidemia: Secondary | ICD-10-CM | POA: Insufficient documentation

## 2023-08-22 DIAGNOSIS — E559 Vitamin D deficiency, unspecified: Secondary | ICD-10-CM

## 2023-08-22 DIAGNOSIS — E1065 Type 1 diabetes mellitus with hyperglycemia: Secondary | ICD-10-CM

## 2023-08-22 DIAGNOSIS — Z4681 Encounter for fitting and adjustment of insulin pump: Secondary | ICD-10-CM

## 2023-08-22 DIAGNOSIS — E785 Hyperlipidemia, unspecified: Secondary | ICD-10-CM

## 2023-08-22 LAB — POCT GLYCOSYLATED HEMOGLOBIN (HGB A1C): Hemoglobin A1C: 6.5 % — AB (ref 4.0–5.6)

## 2023-08-22 LAB — POCT GLUCOSE (DEVICE FOR HOME USE): Glucose Fasting, POC: 138 mg/dL — AB (ref 70–99)

## 2023-08-22 NOTE — Progress Notes (Signed)
 Pediatric Endocrinology Diabetes Consultation Follow-up Visit  Melanie Morrow 2000/07/03 [id number]  Chief Complaint: Follow-up type 1 diabetes   Melanie Morrow   HPI: Melanie Morrow  is a 23 y.o. female presenting for follow-up of type 1 diabetes. she is accompanied to this visit by her mother.  1. Melanie Morrow is a 43 y.o. with history of pervasive developmental disorder (also non-verbal) who was diagnosed with T1DM on 08/22/16 after presenting to her PCP with polyuria, polydipsia, weight loss and hyperglycemia.  At PCP's office she had BG of 238 with UA showing 2+ glucose and 2+ ketones.  She was admitted to [facility] pediatric floor where she was not in DKA.  She was started on an MDI regimen.  TFTs on admission were normal with negative ICA Ab and negative insulin  Ab; GAD Ab were positive.  C-peptide was low at 1.9.  A1c was 10%.  2. Since last visit on 04/2023  she has been well.    She has been busy traveling, recently when to [place]. She is doing a day program to days per week which has been going well and has not had issues with her diabetes while there.   She is wearing Dexcom G7 which is working well for her. Takes Novolog  injections after eating, rarely misses an injection. Averages 50-70 grams per meal but does have times where she will eat up to 100 grams. Breakfast tends to be highest carb meal. She tends to run highest after lunch when she is at her day program (uses 1:15 grams). Hypoglycemia does no occur often, none severe or requiring glucagon . Mom also reports that at night they will get low alerts and when she does a finger stick it is not low.   Takes 5 mg of Rosuvastatin  daily and 4000 units of vitamin D3 once daily.    Insulin  regimen: Lantus  16 units  . Novolog  150/50/10 half unit plan. Uses 150/50/15 at lunch on Thursdays when she is at the day program. .  Hypoglycemia: Few lows since last visit, no glucagon  needed. CGM Download: Using dexcom  Med-alert ID: Wearing a  new bracelet today Injection sites: arms, legs and abdomen. Rotating.  Uses arms for CGM  Annual labs due: Ordered  Ophthalmology due: Had exam on 03/2022, due in 1 year. Discussed importance of annual eye exam.    ROS:  All systems reviewed with pertinent positives listed below; otherwise negative. Constitutional: Sleeping well. Weight is stable.  Eyes; no vision changes. No blurry vision.  HENT: No difficulty swallowing. No neck pain  Respiratory: No increased work of breathing currently GI: No constipation or diarrhea GU: Occasional spotting. Has IUD.  Musculoskeletal: No joint deformity Neuro: Normal affect. No tremors or headaches.  Endocrine: As above    Past Medical History:   Past Medical History:  Diagnosis Date   Cognitive impairment    Patient has SAT B2 associated syndrome.   Development delay    Has been evaluated by genetics and has normal chromosomes   Dyspraxia    Family history of premature CAD    History of sinus tachycardia    Cardiologist, Dr. Ruthine Cowper. 01/03/22 echocardiogram in [facility].   Non-verbal learning disorder    Type 1 diabetes mellitus (HCC) 08/22/2016   +GAD Ab, neg insulin  Ab and ICA Ab / Follows with [facility] Endocrinology.    Medications:  Outpatient Encounter Medications as of 08/22/2023  Medication Sig   Accu-Chek FastClix Lancets MISC Use as directed to check glucose 6x/day.  Alcohol  Swabs (ALCOHOL  PADS) 70 % PADS 6 wipes per day   Cholecalciferol (VITAMIN D3) 30 MCG/15ML LIQD Take by mouth. Drops on breakfast food in the am.   Continuous Glucose Receiver (DEXCOM G7 RECEIVER) DEVI USE WITH DEXCOM G7 SENSOR/TRANSMITTER   Continuous Glucose Sensor (DEXCOM G7 SENSOR) MISC Change every 10 days   glucose blood (ACCU-CHEK GUIDE TEST) test strip Use as instructed 6x/day   insulin  glargine (LANTUS  SOLOSTAR) 100 UNIT/ML Solostar Pen INJECT UP TO 50 UNITS SUBCUTANEOUSLY DAILY   levonorgestrel  (MIRENA ) 20 MCG/DAY IUD by  Intrauterine route.   NOVOLOG  FLEXPEN 100 UNIT/ML FlexPen Use up to 50 units daily.   rosuvastatin  (CRESTOR ) 5 MG tablet Take 1 tablet (5 mg total) by mouth daily. (Patient taking differently: Take 5 mg by mouth every evening.)   BD PEN NEEDLE NANO 2ND GEN 32G X 4 MM MISC USE TO INJECT INSULIN  6 TIMES DAILY (Patient not taking: Reported on 08/22/2023)   Blood Glucose Monitoring Suppl (ACCU-CHEK GUIDE ME) w/Device KIT Use to check blood sugar up to 6 times a day (Patient not taking: Reported on 08/22/2023)   diazePAM 5 MG/5ML SOLN Take by mouth as needed. (Patient not taking: Reported on 02/26/2023)   Glucagon  (GVOKE HYPOPEN  2-PACK) 1 MG/0.2ML SOAJ Inject subcutaneously or intramuscularly prn severe hypoglycemia or unresponsiveness. (Patient not taking: Reported on 08/22/2023)   Glucagon , rDNA, (GLUCAGON  EMERGENCY) 1 MG KIT FOLLOW PACKAGE DIRECTIONS FOR LOW BLOOD SUGAR. (Patient not taking: Reported on 08/22/2023)   ibuprofen  (ADVIL ) 600 MG tablet Take 1 tablet (600 mg total) by mouth every 6 (six) hours as needed. (Patient not taking: Reported on 08/22/2023)   Norethin Ace-Eth Estrad-FE 1-20 MG-MCG(24) CHEW Chew by mouth. (Patient not taking: Reported on 08/22/2023)   No facility-administered encounter medications on file as of 08/22/2023.    Allergies: Allergies  Allergen Reactions   Amoxicillin Hives and Rash   Cefdinir Rash   Penicillins Hives    Surgical History: Past Surgical History:  Procedure Laterality Date   DILATION AND CURETTAGE, DIAGNOSTIC / THERAPEUTIC  2017   INTRAUTERINE DEVICE (IUD) INSERTION  2017   INTRAUTERINE DEVICE (IUD) INSERTION N/A 03/03/2023   Procedure: INTRAUTERINE DEVICE (IUD) INSERTION;  Surgeon: Margaretmary Shaver, MD;  Location: [facility];  Service: Gynecology;  Laterality: N/A;   IUD REMOVAL N/A 03/03/2023   Procedure: INTRAUTERINE DEVICE (IUD) REMOVAL;  Surgeon: Margaretmary Shaver, MD;  Location: [facility];  Service:  Gynecology;  Laterality: N/A;   MULTIPLE TOOTH EXTRACTIONS Bilateral     Family History:  Family History  Problem Relation Age of Onset   Diabetes Mother    Hyperlipidemia Mother    Diabetes Maternal Uncle    Diabetes Maternal Grandfather    Dementia Maternal Grandfather    Hyperlipidemia Maternal Grandfather    Hypertension Maternal Grandfather    Hypertension Paternal Grandmother    No family history of T1DM or hypothyroidism   Social History: Lives with: parents, grandparents also involved in her care   Physical Exam:  Vitals:   08/22/23 0901  BP: 110/80  Pulse: 90  Weight: 134 lb 8 oz (61 kg)       BP 110/80 (BP Location: Left Arm, Patient Position: Sitting, Cuff Size: Normal)   Pulse 90   Wt 134 lb 8 oz (61 kg)   BMI 25.30 kg/m  Body mass index: body mass index is 25.3 kg/m. Growth %ile SmartLinks can only be used for patients less than 24 years old.  Ht Readings from  Last 3 Encounters:  05/13/23 5' 1.14" (1.553 m)  03/03/23 5\' 1"  (1.549 m)  10/26/19 5' 0.67" (1.541 m) (8%, Z= -1.42)*   * Growth percentiles are based on CDC (Girls, 2-20 Years) data.   Wt Readings from Last 3 Encounters:  08/22/23 134 lb 8 oz (61 kg)  05/13/23 135 lb (61.2 kg)  03/03/23 135 lb (61.2 kg)   General: Well developed, well nourished female in no acute distress.  Head: Normocephalic, atraumatic.   Eyes:  Pupils equal and round. EOMI.   Sclera white.  No eye drainage.   Ears/Nose/Mouth/Throat: Nares patent, no nasal drainage.  Normal dentition, mucous membranes moist.   Neck: supple, no cervical lymphadenopathy, no thyromegaly Cardiovascular: regular rate, normal S1/S2, no murmurs Respiratory: No increased work of breathing.  Lungs clear to auscultation bilaterally.  No wheezes. Abdomen: soft, nontender, nondistended. No appreciable masses  Extremities: warm, well perfused, cap refill < 2 sec.   Musculoskeletal: Normal muscle mass.  Normal strength Skin: warm, dry.  No  rash or lesions. Neurologic: alert and oriented,  no tremor   Labs:   Results for orders placed or performed in visit on 08/22/23  POCT Glucose (Device for Home Use)   Collection Time: 08/22/23  9:18 AM  Result Value Ref Range   Glucose Fasting, POC 138 (A) 70 - 99 mg/dL   POC Glucose    POCT glycosylated hemoglobin (Hb A1C)   Collection Time: 08/22/23  9:19 AM  Result Value Ref Range   Hemoglobin A1C 6.5 (A) 4.0 - 5.6 %   HbA1c POC (<> result, manual entry)     HbA1c, POC (prediabetic range)     HbA1c, POC (controlled diabetic range)       Assessment/Plan: Brihany is a 23 y.o. female with type 1 diabetes on MDI and Dexcom in good control. Hemoglobin A1c is 6.5% today which meets ADA goal of <7%. Her time in target range is 52% , goal is >70%.    1-3. Controlled diabetes mellitus type 1 without complications (HCC)/Developmental delay/Insulin  titration.  - Reviewed insulin  pump and CGM download. Discussed trends and patterns.  - Rotate pump sites to prevent scar tissue.  - bolus 15 minutes prior to eating to limit blood sugar spikes.  - Reviewed carb counting and importance of accurate carb counting.  - Discussed signs and symptoms of hypoglycemia. Always have glucose available.  - POCT glucose and hemoglobin A1c  - Reviewed growth chart.  - Refer to adult endocrionlogy. Discussed options for local referrals including [facility], [facility] and [facility] endocr[facility]ology. Referral placed per patient preference.  Lab Orders         Lipid panel         Microalbumin / creatinine urine ratio         T4, free         TSH         Comprehensive metabolic panel with GFR         Vitamin D  (25 hydroxy)         POCT Glucose (Device for Home Use)         POCT glycosylated hemoglobin (Hb A1C)       4. Elevated alkaline phosphatase /Hypovitamin D / genetic syndrome SATB2 - She has been evaluated by [name] and genetics. - 4000 units of vitamin D3 daily.  Lab Orders          Lipid panel         Microalbumin / creatinine urine ratio  T4, free         TSH         Comprehensive metabolic panel with GFR         Vitamin D  (25 hydroxy)         POCT Glucose (Device for Home Use)         POCT glycosylated hemoglobin (Hb A1C)     5. Hyperlipidemia  - Rosuvastatin  5 mg daily.  Lab Orders         Lipid panel         Microalbumin / creatinine urine ratio         T4, free         TSH         Comprehensive metabolic panel with GFR         Vitamin D  (25 hydroxy)         POCT Glucose (Device for Home Use)         POCT glycosylated hemoglobin (Hb A1C)           Follow-up:  3 months.   LOS: 49 minutes  spent today reviewing the medical chart, counseling the patient/family, and documenting today's visit. This time does not include CGM interpretation.  When a patient is on insulin , intensive monitoring of blood glucose levels is necessary to avoid hyperglycemia and hypoglycemia. Severe hyperglycemia/hypoglycemia can lead to hospital admissions and be life threatening.    Candee Cha, DNP, FNP-C  Pediatric Specialist  [address]  [place], [address]  Tele: [phone]

## 2023-08-22 NOTE — Patient Instructions (Signed)
 It was a pleasure seeing you in clinic today. Please do not hesitate to contact me if you have questions or concerns.   Please sign up for MyChart. This is a communication tool that allows you to send an email directly to me. This can be used for questions, prescriptions and blood sugar reports. We will also release labs to you with instructions on MyChart. Please do not use MyChart if you need immediate or emergency assistance. Ask our wonderful front office staff if you need assistance.   - Referral placed to [facility] endocrinology.

## 2023-08-23 LAB — TSH: TSH: 1.8 m[IU]/L

## 2023-08-23 LAB — T4, FREE: Free T4: 0.9 ng/dL (ref 0.8–1.8)

## 2023-08-25 ENCOUNTER — Ambulatory Visit (INDEPENDENT_AMBULATORY_CARE_PROVIDER_SITE_OTHER): Payer: Self-pay | Admitting: Family

## 2023-09-16 ENCOUNTER — Telehealth: Payer: Self-pay | Admitting: Internal Medicine

## 2023-09-16 NOTE — Telephone Encounter (Signed)
 I called patient's mother to schedule an appointment,Mother stated that the patient's insurance only allows her A1C to be checked every 90 days.Patient was seen by another provider on 08/22/23 and her A1C was checked at that appointment.Mother wants to know if patient still needs to be seen on 09/18/23 with insurance not covering her A1C test.Please advise.

## 2023-09-16 NOTE — Telephone Encounter (Signed)
 I see the block is set for fast pass on 09/18/23,do I still need to schedule the patient for that day at 11:00?

## 2023-09-18 ENCOUNTER — Encounter: Payer: Self-pay | Admitting: Internal Medicine

## 2023-09-22 ENCOUNTER — Encounter: Payer: Self-pay | Admitting: Internal Medicine

## 2023-09-22 ENCOUNTER — Ambulatory Visit (INDEPENDENT_AMBULATORY_CARE_PROVIDER_SITE_OTHER): Admitting: Internal Medicine

## 2023-09-22 VITALS — BP 112/70 | HR 72 | Resp 20 | Ht 61.0 in | Wt 135.0 lb

## 2023-09-22 DIAGNOSIS — E1065 Type 1 diabetes mellitus with hyperglycemia: Secondary | ICD-10-CM

## 2023-09-22 DIAGNOSIS — E782 Mixed hyperlipidemia: Secondary | ICD-10-CM

## 2023-09-22 MED ORDER — FIASP FLEXTOUCH 100 UNIT/ML ~~LOC~~ SOPN: PEN_INJECTOR | SUBCUTANEOUS | 3 refills | Status: AC

## 2023-09-22 NOTE — Patient Instructions (Addendum)
 Please continue: - Lantus  16 units daily  Try to change from Novolog  to: - FiAsp  - target 150, ICR 1:10, ISF 50  Try to inject FiAsp  at the start of the meal.  Please come back in 3-4 months.  Basic Rules for Patients with Type I Diabetes Mellitus  The American Diabetes Association (ADA) recommended targets: - fasting sugar 80-130 - after meal sugar <180 - HbA1C <7% . Make sure you have >=8h of sleep every night as this helps both blood sugars and your weight.  Always keep a sugar log (not only record in your meter) and bring it to all appointments with us .  "15-15 rule" for hypoglycemia: if sugars are low, take 15 g of carbs** ("fast sugar" - e.g. 4 glucose tablets, 4 oz orange juice), wait 15 min, then check sugars again. If still <80, repeat. Continue  until your sugars >80, then eat a normal meal.   Teach family members and coworkers to inject glucagon . Have a glucagon  set at home and one at work. They should call 911 after using the set.  If >250, check urine for ketones. If you have moderate-large ketones in urine, do not start exercise. Hydrate yourself with clear liquids and correct the high sugar. Recheck sugars and ketones before attempting to exercise.  Be aware that you might need less insulin  when exercising.  *intense, short, exercise bursts can increase your sugars, but  *less intense, longer (>1h), exercise routines can decrease your sugars.   Make sure you have a MedAlert bracelet or pendant mentioning "Type I Diabetes Mellitus". If you have a prior episode of severe hypoglycemia or hypoglycemia unawareness, it should also mention this.  Please do not walk barefoot. Inspect your feet for sores/cuts and let us  know if you have them.  **E.g. of "fast carbs": first choice (15 g):  1 tube glucose gel, GlucoPouch 15, 2 oz glucose liquid second choice (15-16 g):  3 or 4 glucose tablets (best taken  with water ), 15 Dextrose  Bits chewable third choice (15-20 g):    cup fruit juice,  cup regular soda, 1 cup skim milk,  1 cup sports drink fourth choice (15-20 g):  1 small tube [place] gel (not frosting), 2 tbsp raisins, 1 tbsp table sugar,  candy, jelly beans, gum drops - check package for carb amount   (adapted from: Jannetta BROCHURE. "Insulin  therapy and hypoglycemia" Endocrinol Metab Clin N Am 2012, 41: 57-87)  Sick Day Rules for Diabetes  Think S-K-I-L-L:  Sugars:  - if glucose >200, check every 3h and drink sugar free liquids  - if glucose <200, drink carb-containing liquids and recheck 30 min later  - if glucose high, correct with insulin   - if sugars <60, initiate hypoglycemia management (take 15 g of fast carbs and check sugars in 15 min  - repeat until sugars remain >100).  Ketones:  When to check ketones?  When glucose >300 x2 if on insulin  injections (>300 x 1 if on insulin  pump). When nausea, vomiting, diarrhea, abdominal pain, headache, fever - even if glucose is normal or low - because in this case, you need both glucose and insulin .    - if you have ketone strips for blood >> if ketones are more or equal than 0.6, need to increase insulin  - if you have ketone strips for urine >> if ketones are more or equal than small, need to increase insulin   Insulin : Never skip long acting insulin , even if not eating!   Urine ketones Blood ketones  Extra insulin ?  no <0.6 no  small 0.6-1.5 Increase dose by 5%  moderate 1.5-3 Increase dose by 10%  large >3 Increase dose by at least 20%   Liquids: - if glucose >200, check every 3h and drink sugar free liquids  - if glucose <200, small sips of carb-containing liquids (e.g. Ginger ale, Gatorade, juice, etc.)  Let us  know!   Call us  if: Go to ED if: Call your primary care doctor if:  Sugars >300 for >8h Severe abdominal pain Fever >100F for 24h  Moderate to large  urine ketones or blood ketones >1.5 Difficulty breathing Other chronic diseases flaring up  Vomiting and unable to keep  liquids down Signs of dehydration

## 2023-09-22 NOTE — Progress Notes (Signed)
 Patient ID: Melanie Morrow, female   DOB: Aug 07, 2000, 23 y.o.   MRN: [id number]  HPI: Melanie Morrow is a 23 y.o.-year-old female, referred by her PCP, [name], PA-C, for management of DM1, diagnosed in 09/2016, controlled, without long term complications.  She is here with her mother who offers all of the history as patient is nonverbal (developmental delay, SATB2-Associated Syndrome). She is the niece of Reyes Aguas, also my pt.  Her mother mentions that she has development of a toddler, both motor and verbal.  Patient's diabetes is managed by her mother and grandmother and she is going to a day care program ([employer]) 2x a week: 8:30 am to 4 pm - she has lunch there.  Reviewed HbA1c levels: Lab Results  Component Value Date   HGBA1C 6.5 (A) 08/22/2023   HGBA1C 6.6 (A) 05/13/2023   HGBA1C 6.7 (A) 01/28/2023   HGBA1C 6.5 (A) 10/30/2022   HGBA1C 6.4 (A) 07/29/2022   HGBA1C 7.1 (A) 04/25/2022   HGBA1C 6.5 (A) 01/21/2022   HGBA1C 6.5 (A) 10/18/2021   HGBA1C 6.6 (A) 07/16/2021   HGBA1C 6.5 (A) 04/17/2021   She is on: - Lantus  16 units daily - Novolog  target 15, ICR 1:10, ISF 50  Meter: Accu-Chek  Pt checks her sugars more than 4 times a day (Dexcom G7 reader) - mother usually uploads the reports:  Lowest sugar was 69. She does have a glucagon  kit at home [facility]). No previous hypoglycemia admission.  Highest sugar was 322. No previous DKA admissions.    Pt's meals are: - Breakfast: toaster strudel, pancakes, muffins, mini sausage biscuits - Lunch: chicken nuggets, sandwiches, [employer] pasta, applesauce, pudding - Dinner: variety of chicken dishes, ground beef dishes, likes [place] food, and fish.   She eats vegetables and starches - corn, potatoes fixed many ways, green beans, green peas, zuchinni, onions, etc. - Snacks: fruit snacks, rice krispy treats, fruit cups No sweet drinks - only drinks water  or milk  - no CKD, last BUN/creatinine:  Lab Results   Component Value Date   BUN 11 11/06/2022   BUN 12 07/16/2021   CREATININE 0.57 11/06/2022   CREATININE 0.62 07/16/2021   Lab Results  Component Value Date   MICRALBCREAT 8 11/06/2022   MICRALBCREAT 16 07/18/2021   MICRALBCREAT 23 10/15/2018   MICRALBCREAT 32 (H) 10/21/2017  She is not on ACE inhibitor/ARB  - + HL; last set of lipids: Lab Results  Component Value Date   CHOL 183 11/06/2022   HDL 57 11/06/2022   LDLCALC 100 (H) 11/06/2022   TRIG 161 (H) 11/06/2022   CHOLHDL 3.2 11/06/2022  On Crestor  5 mg daily.  - last eye exam was in 03/2022. No DR reportedly.   - no numbness and tingling in her feet.  Last TSH: Lab Results  Component Value Date   TSH 1.80 08/22/2023   She has a h/o high Aphos - saw Dr. Debby Patient at [facility] >> was advised to have a genetic testing done.  She was found to have high bone density.   Pt has FH of DM in mother, maternal uncle and maternal grandfather.  ROS: Constitutional: no weight gain, no weight loss Eyes: no blurry vision Cardiovascular: no CP, no SOB, no leg swelling Respiratory: no cough, no SOB Gastrointestinal: no N, no V, no D, no C Musculoskeletal: no muscle, no joint aches Skin: no rash, no hair loss Neurological: no tremors, no numbness or tingling/no dizziness/no HA  Past Medical History:  Diagnosis  Date   Cognitive impairment    Patient has SAT B2 associated syndrome.   Development delay    Has been evaluated by genetics and has normal chromosomes   Dyspraxia    Family history of premature CAD    History of sinus tachycardia    Cardiologist, Dr. Marcello Lennox. 01/03/22 echocardiogram in [facility].   Non-verbal learning disorder    Type 1 diabetes mellitus (HCC) 08/22/2016   +GAD Ab, neg insulin  Ab and ICA Ab / Follows with [facility].   Past Surgical History:  Procedure Laterality Date   DILATION AND CURETTAGE, DIAGNOSTIC / THERAPEUTIC  2017   INTRAUTERINE DEVICE (IUD) INSERTION   2017   INTRAUTERINE DEVICE (IUD) INSERTION N/A 03/03/2023   Procedure: INTRAUTERINE DEVICE (IUD) INSERTION;  Surgeon: Danielle Rom, MD;  Location: [facility];  Service: Gynecology;  Laterality: N/A;   IUD REMOVAL N/A 03/03/2023   Procedure: INTRAUTERINE DEVICE (IUD) REMOVAL;  Surgeon: Danielle Rom, MD;  Location: [facility];  Service: Gynecology;  Laterality: N/A;   MULTIPLE TOOTH EXTRACTIONS Bilateral    Social History   Socioeconomic History   Marital status: Single    Spouse name: Not on file   Number of children: Not on file   Years of education: Not on file   Highest education level: Not on file  Occupational History   Not on file  Tobacco Use   Smoking status: Never    Passive exposure: Never   Smokeless tobacco: Never  Vaping Use   Vaping status: Never Used  Substance and Sexual Activity   Alcohol  use: Never   Drug use: Never   Sexual activity: Never    Birth control/protection: I.U.D., Pill  Other Topics Concern   Not on file  Social History Narrative   Lives with parents. Stays with both MGM during summer days and after school.   Graduated [employer]   Social Drivers of Health   Financial Resource Strain: Low Risk  (09/17/2022)   Received from [facility]   Overall Financial Resource Strain (CARDIA)    Difficulty of Paying Living Expenses: Not hard at all  Food Insecurity: No Food Insecurity (09/17/2022)   Received from [facility]   Hunger Vital Sign    Within the past 12 months, you worried that your food would run out before you got the money to buy more.: Never true    Within the past 12 months, the food you bought just didn't last and you didn't have money to get more.: Never true  Transportation Needs: No Transportation Needs (09/17/2022)   Received from [facility] - Transportation    Lack of Transportation (Medical): No    Lack of Transportation (Non-Medical): No  Physical Activity:  Insufficiently Active (12/10/2021)   Received from [facility]   Exercise Vital Sign    On average, how many days per week do you engage in moderate to strenuous exercise (like a brisk walk)?: 2 days    On average, how many minutes do you engage in exercise at this level?: 10 min  Stress: No Stress Concern Present (06/04/2020)   Received from [facility] - Occupational Stress Questionnaire    Feeling of Stress : Not at all  Social Connections: Unknown (07/31/2021)   Received from [facility]   Social Network    Social Network: Not on file  Intimate Partner Violence: Unknown (06/22/2021)  Received from [facility]   HITS    Physically Hurt: Not on file    Insult or Talk Down To: Not on file    Threaten Physical Harm: Not on file    Scream or Curse: Not on file   Current Outpatient Medications on File Prior to Visit  Medication Sig Dispense Refill   Accu-Chek FastClix Lancets MISC Use as directed to check glucose 6x/day. 200 each 5   Alcohol  Swabs (ALCOHOL  PADS) 70 % PADS 6 wipes per day 200 each 6   Blood Glucose Monitoring Suppl (ACCU-CHEK GUIDE ME) w/Device KIT Use to check blood sugar up to 6 times a day (Patient taking differently: Use to check blood sugar up to 6 times a day, patient takes as back up to G7) 1 kit 1   Cholecalciferol (VITAMIN D3) 30 MCG/15ML LIQD Take by mouth. Drops on breakfast food in the am.     Continuous Glucose Receiver (DEXCOM G7 RECEIVER) DEVI USE WITH DEXCOM G7 SENSOR/TRANSMITTER 1 each 1   Continuous Glucose Sensor (DEXCOM G7 SENSOR) MISC Change every 10 days 3 each 5   Glucagon  (GVOKE HYPOPEN  2-PACK) 1 MG/0.2ML SOAJ Inject subcutaneously or intramuscularly prn severe hypoglycemia or unresponsiveness. 0.4 mL 3   glucose blood (ACCU-CHEK GUIDE TEST) test strip Use as instructed 6x/day 200 strip 5   ibuprofen  (ADVIL ) 600 MG tablet Take 1 tablet (600 mg total) by mouth every 6 (six) hours as  needed. 30 tablet 1   insulin  aspart (NOVOLOG  FLEXPEN) 100 UNIT/ML FlexPen USE UP TO 50 UNITS DAILY. 45 mL 5   insulin  glargine (LANTUS  SOLOSTAR) 100 UNIT/ML Solostar Pen INJECT UP TO 50 UNITS SUBCUTANEOUSLY DAILY 45 mL 1   levonorgestrel  (MIRENA ) 20 MCG/DAY IUD by Intrauterine route.     rosuvastatin  (CRESTOR ) 5 MG tablet Take 1 tablet (5 mg total) by mouth daily. 90 tablet 1   BD PEN NEEDLE NANO 2ND GEN 32G X 4 MM MISC USE TO INJECT INSULIN  6 TIMES DAILY (Patient not taking: Reported on 08/22/2023) 200 each 5   diazePAM 5 MG/5ML SOLN Take by mouth as needed. (Patient not taking: Reported on 02/26/2023)     Glucagon , rDNA, (GLUCAGON  EMERGENCY) 1 MG KIT FOLLOW PACKAGE DIRECTIONS FOR LOW BLOOD SUGAR. (Patient not taking: Reported on 08/22/2023) 2 kit 1   No current facility-administered medications on file prior to visit.   Allergies  Allergen Reactions   Amoxicillin Hives and Rash   Cefdinir Rash   Penicillins Hives   Family History  Problem Relation Age of Onset   Diabetes Mother    Hyperlipidemia Mother    Diabetes Maternal Uncle    Diabetes Maternal Grandfather    Dementia Maternal Grandfather    Hyperlipidemia Maternal Grandfather    Hypertension Maternal Grandfather    Hypertension Paternal Grandmother    PE: BP 112/70   Pulse 72   Resp 20   Ht 5' 1 (1.549 m)   Wt 135 lb (61.2 kg)   SpO2 99%   BMI 25.51 kg/m  Wt Readings from Last 3 Encounters:  09/22/23 135 lb (61.2 kg)  08/22/23 134 lb 8 oz (61 kg)  05/13/23 135 lb (61.2 kg)   Constitutional: normal weight, in NAD Eyes:  EOMI, no exophthalmos, hypertelorism ENT: no neck masses, no cervical lymphadenopathy Cardiovascular: RRR, No MRG Respiratory: CTA B Musculoskeletal: no deformities Skin:no rashes Neurological: no tremor with outstretched hands  ASSESSMENT: 1. DM1, controlled, without long-term complications, but with hyperglycemia 08/22/2016: - GAD antibodies positive - ICA  and insulin  antibodies negative -  C-peptide decreased at 1.9 - HbA1c at diagnosis: 10%  2. HL  PLAN:  1. Patient with long-standing, uncontrolled DM1, managed by her mother and MGM, as patient has intellectual disability.  They are injecting all of her insulin  injections and checking her blood sugars with her receiver.   - She receives her Lantus  every night and NovoLog  after every meal, after calculating how much she ate for the meal.  They are doing the best with the patient's diet, she does not drink sweet drinks, only water  and milk.  We discussed about possibly eliminating the 2% milk, which she usually had with breakfast. CGM interpretation: -At today's visit, we reviewed her CGM downloads: It appears that 60% of values are in target range (goal >70%), while 40% are higher than 180 (goal <25%), and 0% are lower than 70 (goal <4%).  The calculated average blood sugar is 175.  The projected HbA1c for the next 3 months (GMI) is 7.5%. -Reviewing the CGM trends, sugars are fluctuating in the target range but she does have hyperglycemic spikes after every meal, more prominent after breakfast.  As mentioned above, I recommended to stop the milk, but we also discussed about trying to move the mealtime injections before, rather than after meals.  Due to the developmental delay, this may not be possible, but we discussed with her mother that if she has a meal that she particularly likes, to try to give the entire dose of insulin  before the meal.  If she ends up not eating the entire meals, they can supplement with glucose tablets. To help with this, I also recommended switching from NovoLog  to Fiasp , or maybe Lyumjev, if Fiasp  not covered, since this has a shorter onset.  Otherwise, I did not recommend a change in insulin  doses. - I suggested to: Patient Instructions  Please continue: - Lantus  16 units daily  Try to change from Novolog  to: - FiAsp  - target 150, ICR 1:10, ISF 50  Try to inject FiAsp  at the start of the meal.  Please  come back in 3-4 months.  - advised her to check sugars at different times of the day - check at least 4 times a day, rotating checks - given foot care handout and explained the principles  - given instructions for hypoglycemia management 15-15 rule  - advised for yearly eye exams - advised them to let me know if she needs a glucagon  kit Rx sent to pharmacy - advised to get ketone strips - advised to always have Glu tablets with her - advised for a Med-alert bracelet mentioning type 1 diabetes mellitus. - given information about sick day rules - no signs of other autoimmune disorders - will check an ACR today - Return to clinic in 3 months  2. HL - Reviewed latest lipid panel  Lab Results  Component Value Date   CHOL 183 11/06/2022   HDL 57 11/06/2022   LDLCALC 100 (H) 11/06/2022   TRIG 161 (H) 11/06/2022   CHOLHDL 3.2 11/06/2022  - Continues the statin (Crestor  5 mg daily) without side effects.  Lela Fendt, MD PhD [facility]

## 2023-09-23 ENCOUNTER — Ambulatory Visit: Payer: Self-pay | Admitting: Internal Medicine

## 2023-09-23 LAB — MICROALBUMIN / CREATININE URINE RATIO
Creatinine, Urine: 119 mg/dL (ref 20–275)
Microalb Creat Ratio: 11 mg/g{creat} (ref <30)
Microalb, Ur: 1.3 mg/dL

## 2023-10-05 ENCOUNTER — Encounter: Payer: Self-pay | Admitting: Internal Medicine

## 2023-10-06 MED ORDER — ROSUVASTATIN CALCIUM 5 MG PO TABS: 5.0000 mg | ORAL_TABLET | Freq: Every day | ORAL | 3 refills | Status: AC

## 2023-11-20 ENCOUNTER — Encounter: Payer: Self-pay | Admitting: Internal Medicine

## 2023-11-20 DIAGNOSIS — Z79899 Other long term (current) drug therapy: Secondary | ICD-10-CM

## 2023-11-20 MED ORDER — DEXCOM G7 SENSOR MISC: 5 refills | Status: DC

## 2023-11-26 ENCOUNTER — Other Ambulatory Visit (INDEPENDENT_AMBULATORY_CARE_PROVIDER_SITE_OTHER): Payer: Self-pay | Admitting: Internal Medicine

## 2023-12-18 ENCOUNTER — Encounter: Payer: Self-pay | Admitting: Internal Medicine

## 2024-01-06 ENCOUNTER — Ambulatory Visit: Admitting: Endocrinology

## 2024-01-23 ENCOUNTER — Encounter: Payer: Self-pay | Admitting: Internal Medicine

## 2024-01-23 ENCOUNTER — Other Ambulatory Visit

## 2024-01-23 ENCOUNTER — Ambulatory Visit (INDEPENDENT_AMBULATORY_CARE_PROVIDER_SITE_OTHER): Admitting: Internal Medicine

## 2024-01-23 VITALS — BP 102/60 | HR 71 | Resp 16 | Ht 61.0 in | Wt 138.8 lb

## 2024-01-23 DIAGNOSIS — Z79899 Other long term (current) drug therapy: Secondary | ICD-10-CM

## 2024-01-23 DIAGNOSIS — E1065 Type 1 diabetes mellitus with hyperglycemia: Secondary | ICD-10-CM

## 2024-01-23 DIAGNOSIS — E782 Mixed hyperlipidemia: Secondary | ICD-10-CM

## 2024-01-23 LAB — POCT GLYCOSYLATED HEMOGLOBIN (HGB A1C): Hemoglobin A1C: 6.4 % — AB (ref 4.0–5.6)

## 2024-01-23 MED ORDER — GLUCAGON 3 MG/DOSE NA POWD
3.0000 mg | Freq: Once | NASAL | 11 refills | Status: AC | PRN
Start: 2024-01-23 — End: ?

## 2024-01-23 MED ORDER — DEXCOM G7 SENSOR MISC: 3 refills | Status: AC

## 2024-01-23 NOTE — Progress Notes (Addendum)
 Patient ID: Melanie Morrow, female   DOB: 07/16/2000, 23 y.o.   MRN: [id number]  HPI: Melanie Morrow is a 23 y.o.-year-old female, initially referred by her PCP, [name], PA-C, returning for follow-up for DM1, diagnosed in 09/2016, controlled, without long term complications.  She is here with her mother who offers all of the history as patient is nonverbal (developmental delay, SATB2-Associated Syndrome). She is the niece of Melanie Morrow, also my pt. Our first visit was 4 months ago.  Her mother mentions that she has development of a toddler, both motor and verbal.  Patient's diabetes is managed by her mother and grandmother and she is going to a day care program ([employer]) 2x a week: 8:30 am to 4 pm - she has lunch there.  Interim history: No increased urination, blurry vision, nausea, chest pain.  Reviewed HbA1c levels: Lab Results  Component Value Date   HGBA1C 6.5 (A) 08/22/2023   HGBA1C 6.6 (A) 05/13/2023   HGBA1C 6.7 (A) 01/28/2023   HGBA1C 6.5 (A) 10/30/2022   HGBA1C 6.4 (A) 07/29/2022   HGBA1C 7.1 (A) 04/25/2022   HGBA1C 6.5 (A) 01/21/2022   HGBA1C 6.5 (A) 10/18/2021   HGBA1C 6.6 (A) 07/16/2021   HGBA1C 6.5 (A) 04/17/2021   She is on: - Lantus  16 units daily - Novolog  after meals target 15, ICR 1:10, ISF 50 - did not move the NovoLog  doses 15 minutes before meals or start Fiasp  at the start of the meals as she had a lot of NovoLog  at home  Meter: Accu-Chek  Pt checks her sugars more than 4 times a day (Dexcom G7 reader) - mother usually uploads the reports:  Previously:  Lowest sugar was 69. She does have a glucagon  kit at home [facility]). No previous hypoglycemia admission.  Highest sugar was 322. No previous DKA admissions.    Pt's meals are: - Breakfast: toaster strudel, pancakes, muffins, mini sausage biscuits - Lunch: chicken nuggets, sandwiches, [employer] pasta, applesauce, pudding - Dinner: variety of chicken dishes, ground beef dishes, likes  Mexican food, and fish.   She eats vegetables and starches - corn, potatoes fixed many ways, green beans, green peas, zuchinni, onions, etc. - Snacks: fruit snacks, rice krispy treats, fruit cups No sweet drinks - only drinks water  or milk  - no CKD, last BUN/creatinine:  Lab Results  Component Value Date   BUN 11 11/06/2022   BUN 12 07/16/2021   CREATININE 0.57 11/06/2022   CREATININE 0.62 07/16/2021   Lab Results  Component Value Date   MICRALBCREAT 11 09/22/2023   MICRALBCREAT 8 11/06/2022   MICRALBCREAT 16 07/18/2021   MICRALBCREAT 23 10/15/2018   MICRALBCREAT 32 (H) 10/21/2017  She is not on ACE inhibitor/ARB  - + HL; last set of lipids: Lab Results  Component Value Date   CHOL 183 11/06/2022   HDL 57 11/06/2022   LDLCALC 100 (H) 11/06/2022   TRIG 161 (H) 11/06/2022   CHOLHDL 3.2 11/06/2022  On Crestor  5 mg daily.  - last eye exam was in 03/2022. No DR reportedly.   - no numbness and tingling in her feet.  Last TSH: Lab Results  Component Value Date   TSH 1.80 08/22/2023   She has a h/o high Aphos - saw Dr. Debby Patient at [facility] >> was advised to have a genetic testing done.  She was found to have high bone density.   Pt has FH of DM in mother, maternal uncle and maternal grandfather.  ROS: + See  HPI  Past Medical History:  Diagnosis Date   Cognitive impairment    Patient has SAT B2 associated syndrome.   Development delay    Has been evaluated by genetics and has normal chromosomes   Dyspraxia    Family history of premature CAD    History of sinus tachycardia    Cardiologist, Dr. Marcello Lennox. 01/03/22 echocardiogram in [facility].   Non-verbal learning disorder    Type 1 diabetes mellitus (HCC) 08/22/2016   +GAD Ab, neg insulin  Ab and ICA Ab / Follows with [facility].   Past Surgical History:  Procedure Laterality Date   DILATION AND CURETTAGE, DIAGNOSTIC / THERAPEUTIC  2017   INTRAUTERINE DEVICE (IUD) INSERTION   2017   INTRAUTERINE DEVICE (IUD) INSERTION N/A 03/03/2023   Procedure: INTRAUTERINE DEVICE (IUD) INSERTION;  Surgeon: Danielle Rom, MD;  Location: [facility];  Service: Gynecology;  Laterality: N/A;   IUD REMOVAL N/A 03/03/2023   Procedure: INTRAUTERINE DEVICE (IUD) REMOVAL;  Surgeon: Danielle Rom, MD;  Location: [facility];  Service: Gynecology;  Laterality: N/A;   MULTIPLE TOOTH EXTRACTIONS Bilateral    Social History   Socioeconomic History   Marital status: Single    Spouse name: Not on file   Number of children: Not on file   Years of education: Not on file   Highest education level: Not on file  Occupational History   Not on file  Tobacco Use   Smoking status: Never    Passive exposure: Never   Smokeless tobacco: Never  Vaping Use   Vaping status: Never Used  Substance and Sexual Activity   Alcohol  use: Never   Drug use: Never   Sexual activity: Never    Birth control/protection: I.U.D., Pill  Other Topics Concern   Not on file  Social History Narrative   Lives with parents. Stays with both MGM during summer days and after school.   Graduated [employer]   Social Drivers of Health   Financial Resource Strain: Low Risk  (09/17/2022)   Received from [facility]   Overall Financial Resource Strain (CARDIA)    Difficulty of Paying Living Expenses: Not hard at all  Food Insecurity: No Food Insecurity (09/17/2022)   Received from [facility]   Hunger Vital Sign    Within the past 12 months, you worried that your food would run out before you got the money to buy more.: Never true    Within the past 12 months, the food you bought just didn't last and you didn't have money to get more.: Never true  Transportation Needs: No Transportation Needs (09/17/2022)   Received from [facility] - Transportation    Lack of Transportation (Medical): No    Lack of Transportation (Non-Medical): No  Physical Activity:  Insufficiently Active (12/10/2021)   [facility] from [facility]   Exercise Vital Sign    On average, how many days per week do you engage in moderate to strenuous exercise (like a brisk walk)?: 2 days    On average, how many minutes do you engage in exercise at this level?: 10 min  Stress: No Stress Concern Present (06/04/2020)   Received from [facility] - Occupational Stress Questionnaire    Feeling of Stress : Not at all  Social Connections: Unknown (07/31/2021)   Received from [facility]   Social Network    Social Network: Not on file  Intimate Partner Violence: Unknown (06/22/2021)   Received from [facility]   HITS    Physically Hurt: Not on file    Insult or Talk Down To: Not on file    Threaten Physical Harm: Not on file    Scream or Curse: Not on file   Current Outpatient Medications on File Prior to Visit  Medication Sig Dispense Refill   Accu-Chek FastClix Lancets MISC Use as directed to check glucose 6x/day. 200 each 5   Alcohol  Swabs (ALCOHOL  PADS) 70 % PADS 6 wipes per day 200 each 6   BD PEN NEEDLE NANO 2ND GEN 32G X 4 MM MISC USE TO INJECT INSULIN  6 TIMES DAILY (Patient not taking: Reported on 08/22/2023) 200 each 5   Blood Glucose Monitoring Suppl (ACCU-CHEK GUIDE ME) w/Device KIT Use to check blood sugar up to 6 times a day (Patient taking differently: Use to check blood sugar up to 6 times a day, patient takes as back up to G7) 1 kit 1   Cholecalciferol (VITAMIN D3) 30 MCG/15ML LIQD Take by mouth. Drops on breakfast food in the am.     Continuous Glucose Receiver (DEXCOM G7 RECEIVER) DEVI USE WITH DEXCOM G7 SENSOR/TRANSMITTER 1 each 1   Continuous Glucose Sensor (DEXCOM G7 SENSOR) MISC Change every 10 days 3 each 5   Glucagon  (GVOKE HYPOPEN  2-PACK) 1 MG/0.2ML SOAJ Inject subcutaneously or intramuscularly prn severe hypoglycemia or unresponsiveness. 0.4 mL 3   glucose blood (ACCU-CHEK GUIDE TEST) test strip  Use as instructed 6x/day 200 strip 5   ibuprofen  (ADVIL ) 600 MG tablet Take 1 tablet (600 mg total) by mouth every 6 (six) hours as needed. 30 tablet 1   insulin  aspart (FIASP  FLEXTOUCH) 100 UNIT/ML FlexTouch Pen Inject under skin up to 40 units a day as advised 45 mL 3   insulin  glargine (LANTUS  SOLOSTAR) 100 UNIT/ML Solostar Pen INJECT UP TO 50 UNITS SUBCUTANEOUSLY DAILY 45 mL 1   levonorgestrel  (MIRENA ) 20 MCG/DAY IUD by Intrauterine route.     rosuvastatin  (CRESTOR ) 5 MG tablet Take 1 tablet (5 mg total) by mouth daily. 90 tablet 3   No current facility-administered medications on file prior to visit.   Allergies  Allergen Reactions   Amoxicillin Hives and Rash   Cefdinir Rash   Penicillins Hives   Family History  Problem Relation Age of Onset   Diabetes Mother    Hyperlipidemia Mother    Diabetes Maternal Uncle    Diabetes Maternal Grandfather    Dementia Maternal Grandfather    Hyperlipidemia Maternal Grandfather    Hypertension Maternal Grandfather    Hypertension Paternal Grandmother    PE: BP 102/60   Pulse 71   Resp 16   Ht 5' 1 (1.549 m)   Wt 138 lb 12.8 oz (63 kg)   SpO2 99%   BMI 26.23 kg/m  Wt Readings from Last 3 Encounters:  01/23/24 138 lb 12.8 oz (63 kg)  09/22/23 135 lb (61.2 kg)  08/22/23 134 lb 8 oz (61 kg)   Constitutional: normal weight, in NAD Eyes:  EOMI, no exophthalmos, hypertelorism ENT: no neck masses, no cervical lymphadenopathy Cardiovascular: RRR, No MRG Respiratory: CTA B Musculoskeletal: no deformities Skin:no rashes Neurological: no tremor with outstretched hands  ASSESSMENT: 1. DM1, controlled, without long-term complications, but with hyperglycemia 08/22/2016: - GAD antibodies positive - ICA and insulin  antibodies negative - C-peptide decreased at 1.9 - HbA1c at diagnosis: 10%  2. HL  PLAN:  1. Patient with longstanding uncontrolled type 1  diabetes, managed by her mother and maternal grandmother, as patient has  intellectual disability.  They are injecting all of her insulin  injections and checking her blood sugars with her receiver.  She is on Lantus  every night and NovoLog , previously given after every meal after calculating, she ate for the meal.  They were doing the best with the patient's diet, not giving her sweet drinks, but giving her 2% milk with breakfast.  We discussed about trying to eliminate this.  At last visit, reviewing the CGM trends, sugars were fluctuating within the target range but she did have hyperglycemic spikes after every meal, consistent with insufficient mealtime coverage.  As mentioned above, I recommended to stop milk in the morning since sugars after this meal were the highest.  I also recommended to try to move the mealtime insulin  injections before the actual meals and recommended to switch from NovoLog  to Fiasp  or Lyumjev, which are injected right at the start of the meals.  Otherwise I did not recommend a change in regimen. CGM interpretation: -At today's visit, we reviewed her CGM downloads: It appears that 59% of values are in target range (goal >70%), while 41% are higher than 180 (goal <25%), and 0% are lower than 70 (goal <4%).  The calculated average blood sugar is 173.  The projected HbA1c for the next 3 months (GMI) is 7.4%. -Reviewing the CGM trends, sugars appear to still increase after every meal, and then dropping slightly lower after the meals.  Upon questioning, they did not switch from NovoLog  to Fiasp  as she had (still has) NovoLog  at home.  Mother thinks she has 4 more boxes.  They also did not move NovoLog  15 minutes before the meals, but still taking it after the meals.  We discussed that this is conducive to high blood sugars right after the meals and drop in blood sugars in the late postprandial period.  Since they would want to exhaust supply, I advised them move NovoLog  10 to 15 minutes before meals, especially as Dreya is usually finishing her meals but I  advised them that if she cannot finish the meal, to give her glucose tablets to account for the missed carbs.  Mother agrees with the plan. -I called in intranasal glucagon  for her and advised him how to use it. - I suggested to: Patient Instructions  Please continue: - Lantus  16 units daily  Move: - NovoLog  15 min before each meal: target 150, ICR 1:10, ISF 50 If you start FiAsp , this is injected at the start of the meal.  Please come back in 3-4 months.  - we checked her HbA1c: 6.4% (slightly better) - advised to check sugars at different times of the day - 4x a day, rotating check times - advised for yearly eye exams >> she is UTD - I tried to do a foot exam today but the patient adamantly refused it and started to cry.  I gave the mother a monofilament and advised her to show it to the patient at home to get her familiar with this before we check the foot exam at next visit. - return to clinic in 3-4 months  2. HL - Latest lipid panel showed an LDL and triglycerides slightly above above target: Lab Results  Component Value Date   CHOL 183 11/06/2022   HDL 57 11/06/2022   LDLCALC 100 (H) 11/06/2022   TRIG 161 (H) 11/06/2022   CHOLHDL 3.2 11/06/2022  - She continues on Crestor  5 mg daily without  side effects - She is due for another lipid panel-will check this today  Orders Placed This Encounter  Procedures   TSH    Lipid Panel w/reflex Direct LDL   Comprehensive metabolic panel with GFR   Component     Latest Ref Rng 01/23/2024  Hemoglobin A1C     4.0 - 5.6 % 6.4 !   Alkaline phosphatase (APISO)     31 - 125 U/L 701 (H)   Sodium     135 - 146 mmol/L 139   Potassium     3.5 - 5.3 mmol/L 4.2   Chloride     98 - 110 mmol/L 104   CO2     20 - 32 mmol/L 27   Glucose     65 - 99 mg/dL 848 (H)   BUN     7 - 25 mg/dL 15   Creatinine     9.49 - 0.96 mg/dL 9.54 (L)   Calcium      8.6 - 10.2 mg/dL 9.4   Total Protein     6.1 - 8.1 g/dL 6.8   AST     10 - 30 U/L 13    ALT     6 - 29 U/L 10   Total Bilirubin     0.2 - 1.2 mg/dL 1.1   TSH     mIU/L 7.97   eGFR     > OR = 60 mL/min/1.66m2 138   BUN/Creatinine Ratio     6 - 22 (calc) 33 (H)   Albumin MSPROF     3.6 - 5.1 g/dL 4.4   Globulin     1.9 - 3.7 g/dL (calc) 2.4   AG Ratio     1.0 - 2.5 (calc) 1.8   Cholesterol     <200 mg/dL 871   HDL Cholesterol     > OR = 50 mg/dL 52   Triglycerides     <150 mg/dL 67   LDL Cholesterol (Calc)     mg/dL (calc) 62   Total CHOL/HDL Ratio     <5.0 (calc) 2.5   Non-HDL Cholesterol (Calc)     <130 mg/dL (calc) 76   Labs are at goal with the exception of the very high alkaline phosphatase, which is chronic for her.  This can be a feature of the SATB2-Associated Syndrome.   Lela Fendt, MD PhD [facility] Endocr[facility]

## 2024-01-23 NOTE — Addendum Note (Signed)
 Addended by: ARELIA DIETRICH SAILOR on: 01/23/2024 11:57 AM   Modules accepted: Orders

## 2024-01-23 NOTE — Patient Instructions (Addendum)
 Please continue: - Lantus  16 units daily  Move: - NovoLog  15 min before each meal: target 150, ICR 1:10, ISF 50 If you start FiAsp , this is injected at the start of the meal.  Please come back in 3-4 months.

## 2024-01-24 LAB — LIPID PANEL W/REFLEX DIRECT LDL
Cholesterol: 128 mg/dL (ref <200)
HDL: 52 mg/dL (ref >=50)
LDL Cholesterol (Calc): 62 mg/dL
Non-HDL Cholesterol (Calc): 76 mg/dL (ref <130)
Total CHOL/HDL Ratio: 2.5 (calc) (ref <5.0)
Triglycerides: 67 mg/dL (ref <150)

## 2024-01-24 LAB — COMPREHENSIVE METABOLIC PANEL WITH GFR
AG Ratio: 1.8 (calc) (ref 1.0–2.5)
ALT: 10 U/L (ref 6–29)
AST: 13 U/L (ref 10–30)
Albumin: 4.4 g/dL (ref 3.6–5.1)
Alkaline phosphatase (APISO): 701 U/L — ABNORMAL HIGH (ref 31–125)
BUN/Creatinine Ratio: 33 (calc) — ABNORMAL HIGH (ref 6–22)
BUN: 15 mg/dL (ref 7–25)
CO2: 27 mmol/L (ref 20–32)
Calcium: 9.4 mg/dL (ref 8.6–10.2)
Chloride: 104 mmol/L (ref 98–110)
Creat: 0.45 mg/dL — ABNORMAL LOW (ref 0.50–0.96)
Globulin: 2.4 g/dL (ref 1.9–3.7)
Glucose, Bld: 151 mg/dL — ABNORMAL HIGH (ref 65–99)
Potassium: 4.2 mmol/L (ref 3.5–5.3)
Sodium: 139 mmol/L (ref 135–146)
Total Bilirubin: 1.1 mg/dL (ref 0.2–1.2)
Total Protein: 6.8 g/dL (ref 6.1–8.1)
eGFR: 138 mL/min/1.73m2 (ref >=60)

## 2024-01-24 LAB — TSH: TSH: 2.02 m[IU]/L

## 2024-01-26 ENCOUNTER — Ambulatory Visit: Payer: Self-pay | Admitting: Internal Medicine

## 2024-02-04 ENCOUNTER — Encounter: Payer: Self-pay | Admitting: Internal Medicine

## 2024-02-04 ENCOUNTER — Telehealth: Payer: Self-pay

## 2024-02-04 ENCOUNTER — Other Ambulatory Visit (HOSPITAL_COMMUNITY): Payer: Self-pay

## 2024-02-04 NOTE — Telephone Encounter (Signed)
 Pharmacy Patient Advocate Encounter   Received notification from Patient Advice Request messages that prior authorization for Dexcom G7 sensor is required/requested.   Insurance verification completed.   The patient is insured through [facility] MEDICAID.   Per test claim: PA required; PA submitted to above mentioned insurance via Latent Key/confirmation #/EOC [id number] Status is pending

## 2024-02-09 ENCOUNTER — Other Ambulatory Visit (HOSPITAL_COMMUNITY): Payer: Self-pay

## 2024-02-09 NOTE — Telephone Encounter (Signed)
 Determination has not been received. Test claim is still showing PA is required. I called and they said they have not received the PA so I have re-faxed the form to [phone]. They have advised that we will not receive a determination and will have to call back.   Call reference#: [id number]

## 2024-02-10 ENCOUNTER — Telehealth: Payer: Self-pay | Admitting: Internal Medicine

## 2024-02-10 ENCOUNTER — Telehealth: Payer: Self-pay

## 2024-02-10 MED ORDER — DEXCOM G7 SENSOR MISC: Status: AC

## 2024-02-10 NOTE — Telephone Encounter (Signed)
 Patient is calling to ask if she can get the sample of Continuous Glucose Sensor (DEXCOM G7 SENSOR) MISC this morning while she is waiting on the prior authorization to be completed.  Patient states that her brother can come and pick it up for her if a sample is available.

## 2024-02-10 NOTE — Telephone Encounter (Signed)
 Patient picked up patient assistance - Log Noted picked up 2 Dexcom sensors

## 2024-02-10 NOTE — Telephone Encounter (Signed)
 Sample  Device/Supplies: Dexcom G7 Quantity:2 ONU:[id number] EXP:04/17/25

## 2024-02-17 ENCOUNTER — Encounter: Payer: Self-pay | Admitting: Internal Medicine

## 2024-02-17 ENCOUNTER — Other Ambulatory Visit (HOSPITAL_COMMUNITY): Payer: Self-pay

## 2024-02-17 NOTE — Telephone Encounter (Signed)
 Noted, I have informed the patient.

## 2024-02-17 NOTE — Telephone Encounter (Signed)
 I have called [employer] again and they state they still have not received the form despite transmission confirmation. I have sent the form again. This time I am attempting through the [facility] provider portal. EOC: [id number] W Will call back tomorrow for a status update.

## 2024-02-18 NOTE — Telephone Encounter (Signed)
 Pharmacy Patient Advocate Encounter  Received notification from [place] MEDICA[employer] that Prior Authorization for Dexcom G7 sensor has been APPROVED from 02/17/2024 to 02/16/2025   PA #/Case ID/Reference #: [id number]

## 2024-03-30 ENCOUNTER — Encounter: Payer: Self-pay | Admitting: Internal Medicine

## 2024-03-31 MED ORDER — BD PEN NEEDLE NANO 2ND GEN 32G X 4 MM MISC: 12 refills | Status: AC

## 2024-05-24 ENCOUNTER — Ambulatory Visit: Admitting: Internal Medicine
